# Patient Record
Sex: Female | Born: 1940 | Race: White | Hispanic: No | Marital: Married | Smoking: Former smoker
Health system: Southern US, Community
[De-identification: ages and names within clinical notes are randomized; demographics above are authoritative.]

## PROBLEM LIST (undated history)

## (undated) DIAGNOSIS — H3530 Unspecified macular degeneration: Secondary | ICD-10-CM

## (undated) HISTORY — PX: TUBAL LIGATION: SHX77

## (undated) HISTORY — PX: APPENDECTOMY: SHX54

## (undated) HISTORY — DX: Unspecified macular degeneration: H35.30

---

## 2001-06-23 ENCOUNTER — Ambulatory Visit (HOSPITAL_COMMUNITY): Admission: RE | Admit: 2001-06-23 | Discharge: 2001-06-23 | Payer: Self-pay | Admitting: Family Medicine

## 2001-06-23 ENCOUNTER — Encounter: Payer: Self-pay | Admitting: Family Medicine

## 2002-06-24 ENCOUNTER — Ambulatory Visit (HOSPITAL_COMMUNITY): Admission: RE | Admit: 2002-06-24 | Discharge: 2002-06-24 | Payer: Self-pay | Admitting: Family Medicine

## 2002-06-24 ENCOUNTER — Encounter: Payer: Self-pay | Admitting: Family Medicine

## 2002-07-06 ENCOUNTER — Encounter: Payer: Self-pay | Admitting: Family Medicine

## 2002-07-06 ENCOUNTER — Ambulatory Visit (HOSPITAL_COMMUNITY): Admission: RE | Admit: 2002-07-06 | Discharge: 2002-07-06 | Payer: Self-pay | Admitting: Family Medicine

## 2002-10-25 ENCOUNTER — Ambulatory Visit (HOSPITAL_COMMUNITY): Admission: RE | Admit: 2002-10-25 | Discharge: 2002-10-25 | Payer: Self-pay | Admitting: Family Medicine

## 2002-10-25 ENCOUNTER — Encounter: Payer: Self-pay | Admitting: Family Medicine

## 2003-07-12 ENCOUNTER — Ambulatory Visit (HOSPITAL_COMMUNITY): Admission: RE | Admit: 2003-07-12 | Discharge: 2003-07-12 | Payer: Self-pay | Admitting: Family Medicine

## 2003-08-28 ENCOUNTER — Ambulatory Visit (HOSPITAL_COMMUNITY): Admission: RE | Admit: 2003-08-28 | Discharge: 2003-08-28 | Payer: Self-pay | Admitting: Internal Medicine

## 2003-09-16 HISTORY — PX: COLON SURGERY: SHX602

## 2003-09-19 ENCOUNTER — Encounter (INDEPENDENT_AMBULATORY_CARE_PROVIDER_SITE_OTHER): Payer: Self-pay | Admitting: *Deleted

## 2003-09-19 ENCOUNTER — Inpatient Hospital Stay (HOSPITAL_COMMUNITY): Admission: RE | Admit: 2003-09-19 | Discharge: 2003-09-22 | Payer: Self-pay | Admitting: General Surgery

## 2003-12-14 ENCOUNTER — Ambulatory Visit (HOSPITAL_COMMUNITY): Admission: RE | Admit: 2003-12-14 | Discharge: 2003-12-14 | Payer: Self-pay | Admitting: General Surgery

## 2005-07-11 ENCOUNTER — Ambulatory Visit (HOSPITAL_COMMUNITY): Admission: RE | Admit: 2005-07-11 | Discharge: 2005-07-11 | Payer: Self-pay | Admitting: Family Medicine

## 2005-08-28 ENCOUNTER — Ambulatory Visit: Payer: Self-pay | Admitting: Internal Medicine

## 2005-09-03 ENCOUNTER — Ambulatory Visit: Payer: Self-pay | Admitting: Internal Medicine

## 2005-09-03 ENCOUNTER — Ambulatory Visit (HOSPITAL_COMMUNITY): Admission: RE | Admit: 2005-09-03 | Discharge: 2005-09-03 | Payer: Self-pay | Admitting: Internal Medicine

## 2005-09-03 ENCOUNTER — Encounter: Payer: Self-pay | Admitting: Internal Medicine

## 2005-09-03 HISTORY — PX: COLONOSCOPY: SHX174

## 2006-07-14 ENCOUNTER — Ambulatory Visit (HOSPITAL_COMMUNITY): Admission: RE | Admit: 2006-07-14 | Discharge: 2006-07-14 | Payer: Self-pay | Admitting: Family Medicine

## 2007-07-27 ENCOUNTER — Ambulatory Visit (HOSPITAL_COMMUNITY): Admission: RE | Admit: 2007-07-27 | Discharge: 2007-07-27 | Payer: Self-pay | Admitting: Family Medicine

## 2008-08-01 ENCOUNTER — Ambulatory Visit (HOSPITAL_COMMUNITY): Admission: RE | Admit: 2008-08-01 | Discharge: 2008-08-01 | Payer: Self-pay | Admitting: Family Medicine

## 2009-08-07 ENCOUNTER — Ambulatory Visit (HOSPITAL_COMMUNITY): Admission: RE | Admit: 2009-08-07 | Discharge: 2009-08-07 | Payer: Self-pay | Admitting: Family Medicine

## 2009-12-27 ENCOUNTER — Ambulatory Visit (HOSPITAL_COMMUNITY): Admission: RE | Admit: 2009-12-27 | Discharge: 2009-12-27 | Payer: Self-pay | Admitting: Family Medicine

## 2010-01-14 ENCOUNTER — Encounter (HOSPITAL_COMMUNITY): Admission: RE | Admit: 2010-01-14 | Discharge: 2010-02-13 | Payer: Self-pay | Admitting: Family Medicine

## 2010-02-15 ENCOUNTER — Encounter (HOSPITAL_COMMUNITY): Admission: RE | Admit: 2010-02-15 | Discharge: 2010-03-17 | Payer: Self-pay | Admitting: Sports Medicine

## 2010-03-20 ENCOUNTER — Encounter (HOSPITAL_COMMUNITY): Admission: RE | Admit: 2010-03-20 | Discharge: 2010-04-19 | Payer: Self-pay | Admitting: Sports Medicine

## 2010-08-13 ENCOUNTER — Ambulatory Visit (HOSPITAL_COMMUNITY)
Admission: RE | Admit: 2010-08-13 | Discharge: 2010-08-13 | Payer: Self-pay | Source: Home / Self Care | Admitting: Family Medicine

## 2010-09-10 ENCOUNTER — Encounter (INDEPENDENT_AMBULATORY_CARE_PROVIDER_SITE_OTHER): Payer: Self-pay

## 2010-09-15 HISTORY — PX: COLONOSCOPY: SHX174

## 2010-09-18 ENCOUNTER — Encounter: Payer: Self-pay | Admitting: Internal Medicine

## 2010-09-27 ENCOUNTER — Ambulatory Visit (HOSPITAL_COMMUNITY)
Admission: RE | Admit: 2010-09-27 | Discharge: 2010-09-27 | Payer: Self-pay | Source: Home / Self Care | Attending: Internal Medicine | Admitting: Internal Medicine

## 2010-10-01 ENCOUNTER — Encounter: Payer: Self-pay | Admitting: Internal Medicine

## 2010-10-15 NOTE — Op Note (Signed)
  NAME:  Debra George, Debra George           ACCOUNT NO.:  [id number]  MEDICAL RECORD NO.:  [id number]          PATIENT TYPE:  AMB  LOCATION:  DAY                           FACILITY:  [facility]  PHYSICIAN:  R. Roetta Sessions, M.D. DATE OF BIRTH:  04-Mar-1941  DATE OF PROCEDURE:  09/27/2010 DATE OF DISCHARGE:                              OPERATIVE REPORT   INDICATIONS FOR PROCEDURE:  A 70 year old lady with a history of villous adenoma requiring right hemicolectomy previously with a negative followup colonoscopy about 6 years ago.  She is devoid of any lower GI tract symptoms.  She is here for surveillance.  Risks, benefits, limitations, alternatives and imponderables have been discussed, questions answered.  Please see the documentation in the medical record.  PROCEDURE NOTE:  O2 saturation, blood pressure, pulse and respirations were monitored throughout the entire procedure.  CONSCIOUS SEDATION: 1. Versed 5 mg IV. 2. Demerol 100 mg IV in divided doses.  INSTRUMENT:  Pentax video chip system.  FINDINGS:  Digital rectal exam revealed no abnormalities.  Endoscopic findings:  Prep was excellent.  Colon:  Colonic mass were surveyed from the rectosigmoid junction through the left transverse right colon to the anastomosis with small bowel.  Anastomosis was well seen and photographed for the record.  The neoterminal ileum was intubated 10-cm. The segment of the GI tract appeared normal aside from some minimally adenomatous-looking mucosa at very anastomosis demarcation zone between small bowel and colon, see photos.  This area was biopsied from this level.  Scope was slowly and cautiously withdrawn.  All previously mentioned mucosal surfaces were again seen.  The residual colonic mucosa otherwise appeared entirely normal.  Scope was pulled down into the rectum, where a thorough examination of the rectal mucosa including retroflexed view of the anal verge demonstrated no abnormalities. Withdrawal  time 9 minutes.  IMPRESSION: 1. Normal rectum. 2. Good-looking normal residual colon aside from minimally adenomatous-     appearing mucosa.  The anastomosis with small bowel which may well     be a very abnormal, status post biopsy normal neoterminal ileum.  RECOMMENDATIONS: 1. Followup on path. 2. Further recommendations to follow.     Jonathon Bellows, M.D.     RMR/MEDQ  D:  09/27/2010  T:  09/27/2010  Job:  [id number]  cc:   Donna Bernard, M.D. Fax: [phone]  Electronically Signed by Lorrin Goodell M.D. on 10/15/2010 01:37:08 PM

## 2010-10-17 NOTE — Letter (Signed)
Summary: Patient Notice, Colon Biopsy Results  [facility]  [address]   [place], [place] [address]   Phone: [phone]  Fax: [phone]       October 01, 2010   Debra George [address] [place], [place]  [address] Jan 09, 1941    Dear [name],  I am pleased to inform you that the biopsies taken during your recent colonoscopy did not show any evidence of cancer or other abnormality upon pathologic examination.  Additional information/recommendations:  No further action is needed at this time.  Please follow-up with your primary care physician for your other healthcare needs.  You should have a repeat colonoscopy examination  in 5 years.  Please call us if you are having persistent problems or have questions about your condition that have not been fully answered at this time.  Sincerely,    R. Roetta Sessions MD, FACP [facility] Ph: [phone]    Fax: [phone]   Appended Document: Patient Notice, Colon Biopsy Results letter mailed to pt  Appended Document: Patient Notice, Colon Biopsy Results reminder in computer

## 2010-10-17 NOTE — Letter (Signed)
Summary: Recall, Screening Colonoscopy Only  [facility]  [address]   [place], [place] [address]   Phone: [phone]  Fax: [phone]    September 10, 2010  Debra George [address] [place], [place]  [address] Dec 25, 1940   Dear [name],   Our records indicate it is time to schedule your colonoscopy.    Please call our office at [phone] and ask for the nurse.   Thank you,  Hendricks Limes, LPN Cloria Spring, LPN  [facility] Ph: [phone]   Fax: [phone]

## 2010-10-17 NOTE — Letter (Signed)
Summary: TRIAGE ORDER  TRIAGE ORDER   Imported By: Ave Filter 09/18/2010 11:20:11  _____________________________________________________________________  External Attachment:    Type:   Image     Comment:   External Document

## 2011-01-31 NOTE — H&P (Signed)
NAME:  Debra George, Debra George           ACCOUNT NO.:  [id number]   MEDICAL RECORD NO.:  [id number]          PATIENT TYPE:  AMB   LOCATION:  DAY                           FACILITY:  [facility]   PHYSICIAN:  R. Roetta Sessions, M.D. DATE OF BIRTH:  1940-10-09   DATE OF ADMISSION:  08/28/2005  DATE OF DISCHARGE:  LH                                HISTORY & PHYSICAL   PRIMARY CARE PHYSICIAN:  Donna Bernard, M.D.   CHIEF COMPLAINT:  Follow up large villous adenoma of the cecum.   HISTORY OF PRESENT ILLNESS:  Debra George is a 70 year old, Caucasian female  who underwent a screening colonoscopy by Dr. Jena Gauss on August 28, 2003.  She was found to have a 5 x 5 cm infiltrating sessile appearing polyp  immediately distal to the ileocecal valve which was biopsied and found to be  a villous adenoma.  She was scheduled for right colectomy by Dr. Zachery Dakins  on September 19, 2003.  She has done quite well postoperatively.  She has not  returned until now for further surveillance due to the fact that she did not  have insurance.  She has been doing well.  She denies any problems with her  bowel movements.  She can have anywhere from one to three bowel movements a  day.  She denies any melena.  She has noted some scant bright red rectal  bleeding in her underwear especially when she is walking.  She generally  walks about 2 miles per day.  She also has some proctalgia and rectal  pruritus with this as well.  She feels she may have a hemorrhoid.  She  denies any abdominal pain, nausea or vomiting.  She denies any problems with  weight loss or appetite.   PAST MEDICAL HISTORY:  1.  Villous adenoma of the cecum, status post right colectomy.  Lesion was 5      x 5 cm.  2.  She had two D&Cs.   CURRENT MEDICATIONS:  1.  Os-Cal.  2.  Vitamin D-500 mg b.i.d.  3.  Tylenol p.r.n.   ALLERGIES:  No known drug allergies.   FAMILY HISTORY:  No known family history of colorectal carcinoma, liver or  chronic GI  problems.  Mother age 60 deceased from CHF.  Father age 91  deceased secondary to history of coronary artery disease.  She has one  brother with asthma and COPD.   SOCIAL HISTORY:  Debra George has been married x40 years.  She has one grown  healthy son.  She is retired.  She reports a 16 year old history of tobacco  use quitting in 31.  Denies any drug or alcohol use.   REVIEW OF SYSTEMS:  CONSTITUTIONAL:  Weight is stable.  Denies any fever or  chills.  Denies any fatigue.  CARDIOVASCULAR:  Denies chest pain or  palpitations.  RESPIRATORY:  No dyspnea, cough or hemoptysis.  GASTROINTESTINAL:  See HPI.  She denies any heartburn, indigestion,  dysphagia, odynophagia.   PHYSICAL EXAMINATION:  VITAL SIGNS:  Weight 137.5 pounds, height 68 inches,  temperature 97.6, blood pressure 130/78, pulse 88.  GENERAL:  Debra George is a 70 year old, Caucasian female who is alert and  oriented, pleasant, cooperative in no acute distress.  HEENT:  Sclerae clear, nonicteric, conjunctivae pink.  Oropharynx pink and  moist without any lesions.  NECK:  Supple without any masses or thyromegaly.  CHEST:  Heart regular rate and rhythm with normal S1, S2 without murmurs,  rubs or gallops.  LUNGS:  Clear to auscultation bilaterally.  ABDOMEN:  Positive bowel sounds x4.  No bruits auscultated.  Soft,  nontender, nondistended without palpable mass or hepatosplenomegaly.  No  retrosternal guarding.  EXTREMITIES:  Without clubbing or edema bilaterally.  RECTAL:  No external lesions visualized.  She has good sphincter tone given  her age.  No internal masses palpated.  Hemoccult was not performed.   IMPRESSION:  Debra George is a 70 year old, Caucasian female, status post  right colectomy for a cecal villous adenoma that was 5 x 5 cm in January  2005.  She is overdue for followup surveillance and is in need of  colonoscopy at this time.  She has noted some scant rectal bleeding  intermittently over the last  couple of weeks generally when she does a  significant amount of exercising.  Otherwise, she is doing quite well.   PLAN:  1.  Will schedule her for a colonoscopy with Dr. Jena Gauss in the near future.      I have discussed the procedure including risks and benefits including,      but limited to bleeding, infection, perforation and drug reaction and      consent will be obtained.  2.  Further recommendations pending colonoscopy.   We would like to thank Dr. Gerda Diss for allowing [place] to participate in the care  of Debra George.      Nicholas Lose, N.P.      Jonathon Bellows, M.D.  Electronically Signed    KC/MEDQ  D:  08/28/2005  T:  08/28/2005  Job:  [id number]   cc:   Donna Bernard, M.D.  Fax: [phone]

## 2011-01-31 NOTE — Op Note (Signed)
NAME:  Debra George, Debra George                     ACCOUNT NO.:  [id number]   MEDICAL RECORD NO.:  [id number]                   PATIENT TYPE:  AMB   LOCATION:  DAY                                  FACILITY:  [facility]   PHYSICIAN:  R. Roetta Sessions, M.D.              DATE OF BIRTH:  May 10, 1941   DATE OF PROCEDURE:  08/28/2003  DATE OF DISCHARGE:                                 OPERATIVE REPORT   PROCEDURE:  Screening colonoscopy (ultimately colonoscopy with biopsy).   INDICATIONS FOR PROCEDURE:  The patient is a 70 year old lady who has no  bowel symptoms and no family history of colorectal neoplasia.  She has never  had her entire lower GI tract evaluated.  She reports to me that she did  undergo a sigmoidoscopy some five or six years ago without any significant  findings.  She has been referred by the courtesy of Dr. Lubertha South for  colorectal cancer screening.  Consequently, colonoscopy is now being  offered.  This approach has been discussed with the patient.  The potential  risks, benefits, and alternatives have been reviewed and questions answered.  Please see my handwritten H&P for more information.   PROCEDURE:  O2 saturation, blood pressure, pulses, and respirations were  monitored throughout the entire procedure.  Conscious sedation was with  Versed 4 mg IV, Demerol 75 mg IV in divided doses.  The instrument used was  the Olympus video chip pediatric colonoscope.   FINDINGS:  Digital rectal examination revealed no abnormalities.   ENDOSCOPIC FINDINGS:  The prep was good.   Rectum:  Examination of the rectal mucosa including retroflex view of the  anal verge revealed no abnormalities.   Colon:  The colonic mucosa was surveyed from the rectosigmoid junction  through the left, transverse, right colon to the area of the appendiceal  orifice, ileocecal valve, and cecum.  From this level, the scope was slowly  withdrawn.  All previously mentioned mucosal surfaces were once again  seen.   The following abnormality was noted.  Immediately distal to the ileocecal  valve there was a 5 x 5-cm sprawling, infiltrating sessile neoplastic-  appearing process.  Some parts of this were very hard, and other areas were  soft.  It was biopsied multiple times.  It was not felt that this would be  removable endoscopically, particularly given its location.  Please see  photos.  The remainder of the colonic mucosa appeared normal.  The patient  tolerated the procedure well and was reactive in endoscopy.   IMPRESSION:  1. Normal rectum.  2. A 5 x 5-cnm infiltrating sessile-appearing neoplastic process immediately     distal to the ileocecal valve, biopsied multiple times.  The remainder of     the colonic mucosa appeared normal.   RECOMMENDATIONS:  1. Metabolic profile today, CBC, baseline CEA.  2. Surgical consultation for resection.   I have discussed my findings and recommendations with Dr. Lubertha South  via  telephone today as well as [name] and her husband.      ___________________________________________                                            Jonathon Bellows, M.D.   RMR/MEDQ  D:  08/28/2003  T:  08/28/2003  Job:  [id number]   cc:   Donna Bernard, M.D.  [address]  [place]  [place] [address]  Fax: [phone]

## 2011-01-31 NOTE — Op Note (Signed)
NAME:  Debra George, Debra George           ACCOUNT NO.:  [id number]   MEDICAL RECORD NO.:  [id number]          PATIENT TYPE:  AMB   LOCATION:  DAY                           FACILITY:  [facility]   PHYSICIAN:  R. Roetta Sessions, M.D. DATE OF BIRTH:  August 13, 1941   DATE OF PROCEDURE:  09/03/2005  DATE OF DISCHARGE:                                 OPERATIVE REPORT   PROCEDURE:  Colonoscopy with biopsy.   ENDOSCOPIST:  Gerrit Friends. Rourk, M.D.   INDICATIONS FOR PROCEDURE:  This 70 year old lady was found to have a  villous adenoma in her right colon at ileocecal valve two years ago. It was  not amenable to endoscopic resection.  She saw Dr. Zachery Dakins down in  [place] and had her right colon removed.  I do not have path report  available, but understand that there was no frank carcinoma.  She was to  return one year later, but she states because of insurance reasons she  failed to do so.  She is now back for surveillance.  She has not had any  lower GI tract symptoms.  Colonoscopy is now being done.  This approach has  been discussed with the patient at length. The potential risks, benefits,  and alternatives have been reviewed; questions answered.  She is agreeable.  Please see the documentation in the medical record.   PROCEDURE NOTE:  O2 saturation, blood pressure, pulse and respirations were  monitored throughout the entire procedure.   CONSCIOUS SEDATION:  Versed 4 mg IV, Demerol 75 mg IV in divided doses.   INSTRUMENT:  Olympus video chip system.   FINDINGS:  Digital rectal exam revealed no abnormalities.   ENDOSCOPIC FINDINGS:  The prep was good.   RECTUM:  Examination of the rectal mucosa including the retroflex view of  the anal verge revealed no abnormalities.   COLON:  The colonic mucosa was surveyed from the rectosigmoid junction  through the left transverse colon to the area of anastomosis with small  bowel.  This was well seen and the terminal small-bowel mucosa appeared   normal.   From this level the scope was slowly withdrawn.  All previously mentioned  mucosal surfaces were again seen.  There was a 3-mm diminutive polyp at the  hepatic flexure which was cold biopsied/removed.  The remainder of the  colonic mucosa appeared normal.  The patient tolerated the procedure well  and was reacted in endoscopy.   IMPRESSION:  1.  Normal rectum.  2.  Diminutive polyp at the hepatic flexure cold biopsied/removed.      Otherwise normal residual colonic mucosa.  3.  Normal appearing anastomosis with small bowel.   RECOMMENDATIONS:  1.  Follow up on path.  2.  Further recommendations to follow.      Jonathon Bellows, M.D.  Electronically Signed     RMR/MEDQ  D:  09/03/2005  T:  09/04/2005  Job:  [id number]   cc:   Donna Bernard, M.D.  Fax: [phone]   Anselm Pancoast. Zachery Dakins, M.D.  [address]  [place]  [place] [address]

## 2011-01-31 NOTE — Discharge Summary (Signed)
NAME:  Debra George, Debra George                     ACCOUNT NO.:  [id number]   MEDICAL RECORD NO.:  [id number]                   PATIENT TYPE:  INP   LOCATION:  0442                                 FACILITY:  [facility]   PHYSICIAN:  Anselm Pancoast. Zachery Dakins, M.D.          DATE OF BIRTH:  01-29-1941   DATE OF ADMISSION:  09/19/2003  DATE OF DISCHARGE:  09/22/2003                                 DISCHARGE SUMMARY   DISCHARGE DIAGNOSIS:  Tubovillous adenoma, right colon.   OPERATION:  Right colectomy.   HISTORY:  [name] is a 70 year old Caucasian female __________ Dr.  Lilyan Punt.  The patient had a routine colonoscopy prior to Christmas and  found a large villous adenoma in the ileocecal valve area.  The patient was  not anemic and had not ___________.  I saw her in the office __________ to  proceed with surgery.  She had a __________ surgery.  The patient was taken  to surgery.  Dr. Earlene Plater assisted, and the tumor was small, located within the  proximal cecum, and a right colectomy with stapled side-to-side functional  anastomosis was performed.  Postoperatively, she did nicely.  She was able  to void the following morning after surgery.  Foley was removed.  She was on  a started on a regular diet.  On the first and second postoperative day  ____________.  She was ready for discharge on September 21, 2003.  The path  report shows fortunately this was a large tubovillous adenoma.  There was no  evidence of dysplasia or carcinoma within the specimen.  The tumor itself  measured approximately _____________ 4.2 cm.  The patient will be seen in  the office for followup and the staples will be removed at her followup  appointment.  She was discharged on Vicodin for pain and instructed not to  drive when she is taking Vicodin.                                               Anselm Pancoast. Zachery Dakins, M.D.    WJW/MEDQ  D:  10/18/2003  T:  10/18/2003  Job:  [id number]

## 2011-01-31 NOTE — Op Note (Signed)
NAME:  Debra, George                     ACCOUNT NO.:  [id number]   MEDICAL RECORD NO.:  [id number]                   PATIENT TYPE:  INP   LOCATION:  X006                                 FACILITY:  [facility]   PHYSICIAN:  Anselm Pancoast. Zachery Dakins, M.D.          DATE OF BIRTH:  1940-11-08   DATE OF PROCEDURE:  09/19/2003  DATE OF DISCHARGE:                                 OPERATIVE REPORT   PREOPERATIVE DIAGNOSIS:  Villous adenoma, cecum.   POSTOPERATIVE DIAGNOSIS:  Villous adenoma, cecum.   OPERATION:  Right colectomy.   SURGEON:  Anselm Pancoast. Zachery Dakins, M.D.   ASSISTANT:  Sheppard Plumber. Earlene Plater, M.D.   ANESTHESIA:  General.   HISTORY:  Debra George is a 70 year old Caucasian female referred by  Dr. Marigene Ehlers in [place] where the patient had a routine colonoscopy prior  to Christmas and was found to have a large villous adenoma at the ileocecal  valve area.  The patient was not anemic when I saw her in the office on the  20th, and she elected to wait until after Christmas to proceed with a right  colectomy.  She basically is otherwise in good health.  She had had a  flexible sigmoidoscopy approximately 5 years ago with no family history of  colon cancer and I do not think that they have actually documented blood in  her stool.  She has always been reasonably thin in body habitus, and is not  on any type of chronic medications.  She has had a previous appendectomy and  previous C-section and I think with a tubal ligation.  The patient had  GoLYTELY, erythromycin and Neo-Mycin bowel prep in preparation for this  yesterday and was admitted prior to surgery this morning.  The patient was  given 3 g of Unasyn, she has PAS stockings and was taken to the operative  suite.  The patient had a small area over the left breast that looks like  possibly a epidermal cyst or it may be a cutaneous wart that she wants  excised and after induction of general anesthesia with Betadine we excised  this  and pathology exam - I do not think it is likely to be anything  serious.   The patient's abdomen was then prepped with Betadine surgical scrub  solution. A Foley catheter had been inserted.  I made a midline incision  extending up above where she had had the lower incision and then kind of  carefully entered into the peritoneal cavity.  She did have adhesions in the  lower abdomen where she has had an appendectomy which made a high lying  cecum and it was necessary to extend the incision just a little bit, but  still through this incision.  The omentum dropped down from the peritoneal  surface and I could then visualize the ileocecal valve and the hepatic  flexure and opened the peritoneum laterally and I kind of rotated the cecum  into the  wound. The hepatic flexure was also taken down and could feel a  little thickening in the cecal area but not of obvious hard tumor.  There  was no evidence of any enlargement, no liver ____________.  We then divided  the mesentery and took it down to the base of the right branch of the  superior mesentery and doubly ligated this with 2-0 Vicryl distally and then  the other mesenteric vessels were divided and ligated with 2-0 Vicryl.  I  elected to do a proximal side-to-side ileocecal anastomosis, an  ileotransverse colon anastomosis and the GIA was used to suture the  antimesenteric surface of the terminal ileum about 8 inches from the  ileocecal valve to the tip of the transverse colon and then the cut staple  line was inspected.  There were a couple of bleeders posteriorly requiring a  suture of 3-0 silk, and then we closed the anastomosis with a fire of the TA  60 with short staples and then removed the specimen from the field.  It was  necessary to suture some a few of the suture lines with 3-0 silk with good  hemostasis and the mesenteric defect was then closed with interrupted  sutures of 3-0 silk.  The areas lying about tensioned, dropped them  back  into the peritoneal cavity, the omentum was brought down over it and  reinspected, the pelvis et al, where the colon and hepatic flexure had been  freed up and there was not any bleeding.   The gallbladder looked like it had kind of congenital type adhesions around  it, the gallbladder itself was very thin and neither Dr. Earlene Plater nor I could  feel any stones within it and we took care not to injure the duodenum as the  hepatic flexure was mobilized because of the patient's very thin nature.  The midline incision was closed with running sutures of 0 Prolene with a #1  PDS continuous suture and skin was closed with staples.  We put one simple  stitch in the lower area of the breast that had been removed with 6-0 nylon  at completion of surgery.   The patient tolerated the procedure nicely and we will try to do without an  NG tube but keep it ready to go. I am going to use PCA morphine for  immediate postoperative pain control.  Sponge and needle counts were  correct.  Estimated blood loss was probably about 100 cc.                                               Anselm Pancoast. Zachery Dakins, M.D.    WJW/MEDQ  D:  09/19/2003  T:  09/19/2003  Job:  [id number]   cc:   Donna Bernard, M.D.  [address]  [place]  [place] [address]  Fax: [phone]

## 2011-04-14 ENCOUNTER — Emergency Department (HOSPITAL_COMMUNITY): Payer: Medicare Other

## 2011-04-14 ENCOUNTER — Emergency Department (HOSPITAL_COMMUNITY)
Admission: EM | Admit: 2011-04-14 | Discharge: 2011-04-14 | Disposition: A | Discharge status: Home / Self Care | Payer: Medicare Other | Attending: Emergency Medicine | Admitting: Emergency Medicine

## 2011-04-14 ENCOUNTER — Encounter: Payer: Self-pay | Admitting: Emergency Medicine

## 2011-04-14 DIAGNOSIS — S52509A Unspecified fracture of the lower end of unspecified radius, initial encounter for closed fracture: Secondary | ICD-10-CM

## 2011-04-14 DIAGNOSIS — M7989 Other specified soft tissue disorders: Secondary | ICD-10-CM | POA: Insufficient documentation

## 2011-04-14 DIAGNOSIS — Z87891 Personal history of nicotine dependence: Secondary | ICD-10-CM | POA: Insufficient documentation

## 2011-04-14 DIAGNOSIS — S63076A Dislocation of distal end of unspecified ulna, initial encounter: Secondary | ICD-10-CM

## 2011-04-14 DIAGNOSIS — M2550 Pain in unspecified joint: Secondary | ICD-10-CM | POA: Insufficient documentation

## 2011-04-14 DIAGNOSIS — W19XXXA Unspecified fall, initial encounter: Secondary | ICD-10-CM | POA: Insufficient documentation

## 2011-04-14 DIAGNOSIS — S62109A Fracture of unspecified carpal bone, unspecified wrist, initial encounter for closed fracture: Secondary | ICD-10-CM | POA: Insufficient documentation

## 2011-04-14 MED ORDER — HYDROCODONE-ACETAMINOPHEN 7.5-325 MG PO TABS: 1.0000 | ORAL_TABLET | ORAL | Status: AC | PRN

## 2011-04-14 NOTE — ED Notes (Signed)
Pt states she fell this am. Abrasions to arms bilaterally. Complaining of R wrist pain

## 2011-04-14 NOTE — ED Notes (Signed)
Sugar tong splint and sling applied to R arm per PA order. Pt tolerated well. NAD noted. Awaiting disposition.

## 2011-04-14 NOTE — ED Provider Notes (Addendum)
History     Chief Complaint  Patient presents with  . Fall   Patient is a 70 y.o. female presenting with fall. The history is provided by the patient.  Fall The accident occurred 3 to 5 hours ago. The fall occurred while walking. She landed on concrete. The point of impact was the right wrist (back). The pain is present in the right wrist. The pain is moderate. She was ambulatory at the scene. There was no drug use involved in the accident. There was no alcohol use involved in the accident. Pertinent negatives include no visual change, no numbness, no abdominal pain, no nausea, no vomiting, no hematuria and no loss of consciousness. The symptoms are aggravated by rotation. She has tried ice and elevation for the symptoms. The treatment provided mild relief.    History reviewed. No pertinent past medical history.  History reviewed. No pertinent past surgical history.  History reviewed. No pertinent family history.  History  Substance Use Topics  . Smoking status: Former Games developer  . Smokeless tobacco: Not on file  . Alcohol Use: No    OB History    Grav Para Term Preterm Abortions TAB SAB Ect Mult Living                  Review of Systems  Constitutional: Negative for activity change.       All ROS Neg except as noted in HPI  HENT: Negative for nosebleeds and neck pain.   Eyes: Negative for photophobia and discharge.  Respiratory: Negative for cough, shortness of breath and wheezing.   Cardiovascular: Negative for chest pain and palpitations.  Gastrointestinal: Negative for nausea, vomiting, abdominal pain and blood in stool.  Genitourinary: Negative for dysuria, frequency and hematuria.  Musculoskeletal: Positive for joint swelling and arthralgias. Negative for back pain.  Skin: Negative.   Neurological: Negative for dizziness, seizures, loss of consciousness, speech difficulty and numbness.  Psychiatric/Behavioral: Negative for hallucinations and confusion.    Physical  Exam  BP 144/74  Pulse 91  Temp(Src) 98.2 F (36.8 C) (Oral)  Resp 20  Ht 5\' 8"  (1.727 m)  Wt 135 lb (61.236 kg)  BMI 20.53 kg/m2  SpO2 100%  Physical Exam  Nursing note and vitals reviewed. Constitutional: She is oriented to person, place, and time. She appears well-developed and well-nourished.  Non-toxic appearance.  HENT:  Head: Normocephalic.  Right Ear: Tympanic membrane and external ear normal.  Left Ear: Tympanic membrane and external ear normal.  Eyes: EOM and lids are normal. Pupils are equal, round, and reactive to light.  Neck: Normal range of motion. Neck supple. Carotid bruit is not present.  Cardiovascular: Normal rate, regular rhythm, normal heart sounds, intact distal pulses and normal pulses.   Pulmonary/Chest: Breath sounds normal. No respiratory distress.  Abdominal: Soft. Bowel sounds are normal. There is no tenderness. There is no guarding.  Musculoskeletal:       Swelling and bruising of the right wrist. FROM of the elbow and shoulder. Decrease ROM of the left shoulder (not new).  Abrasions of the mid back. And the left elbow.  Lymphadenopathy:       Head (right side): No submandibular adenopathy present.       Head (left side): No submandibular adenopathy present.    She has no cervical adenopathy.  Neurological: She is alert and oriented to person, place, and time. She has normal strength. No cranial nerve deficit or sensory deficit.  Skin: Skin is warm and dry.  Psychiatric:  She has a normal mood and affect. Her speech is normal.    ED Course  Procedures  MDM I have reviewed nursing notes, vital signs, and all appropriate lab and imaging results for this patient.      Kathie Dike, PA 04/14/11 [address] [place], [place] 08/01/11 [phone]

## 2011-04-28 NOTE — ED Provider Notes (Addendum)
Evaluation and management procedures were performed by the PA/NP under my supervision/collaboration.      Felisa Bonier, MD 08/02/11 [phone]

## 2011-08-01 ENCOUNTER — Other Ambulatory Visit: Payer: Self-pay | Admitting: Family Medicine

## 2011-08-01 DIAGNOSIS — Z139 Encounter for screening, unspecified: Secondary | ICD-10-CM

## 2011-08-02 NOTE — ED Provider Notes (Signed)
Evaluation and management procedures were performed by the PA/NP under my supervision/collaboration.    Felisa Bonier, MD 08/02/11 [phone]

## 2011-08-18 ENCOUNTER — Ambulatory Visit (HOSPITAL_COMMUNITY)
Admission: RE | Admit: 2011-08-18 | Discharge: 2011-08-18 | Disposition: A | Discharge status: Home / Self Care | Payer: Medicare Other | Source: Ambulatory Visit | Attending: Family Medicine | Admitting: Family Medicine

## 2011-08-18 DIAGNOSIS — Z1231 Encounter for screening mammogram for malignant neoplasm of breast: Secondary | ICD-10-CM | POA: Insufficient documentation

## 2011-08-18 DIAGNOSIS — Z139 Encounter for screening, unspecified: Secondary | ICD-10-CM

## 2011-09-11 ENCOUNTER — Other Ambulatory Visit: Payer: Self-pay | Admitting: Dermatology

## 2012-07-30 ENCOUNTER — Other Ambulatory Visit: Payer: Self-pay | Admitting: Family Medicine

## 2012-07-30 DIAGNOSIS — Z139 Encounter for screening, unspecified: Secondary | ICD-10-CM

## 2012-08-19 ENCOUNTER — Ambulatory Visit (HOSPITAL_COMMUNITY)
Admission: RE | Admit: 2012-08-19 | Discharge: 2012-08-19 | Disposition: A | Discharge status: Home / Self Care | Payer: Medicare Other | Source: Ambulatory Visit | Attending: Family Medicine | Admitting: Family Medicine

## 2012-08-19 DIAGNOSIS — Z139 Encounter for screening, unspecified: Secondary | ICD-10-CM

## 2012-08-19 DIAGNOSIS — Z1231 Encounter for screening mammogram for malignant neoplasm of breast: Secondary | ICD-10-CM | POA: Insufficient documentation

## 2012-11-23 IMAGING — MG MM DIGITAL SCREENING
4 series · 4 of 4 positions shown · non-contrast
Comparison: none

DG SCREEN MAMMOGRAM BILATERAL
Bilateral CC and MLO view(s) were taken.

DIGITAL SCREENING MAMMOGRAM WITH CAD:
The breast tissue is heterogeneously dense.  No masses or malignant type calcifications are 
identified.  Compared with prior studies.
Images were processed with CAD.

[L CC]
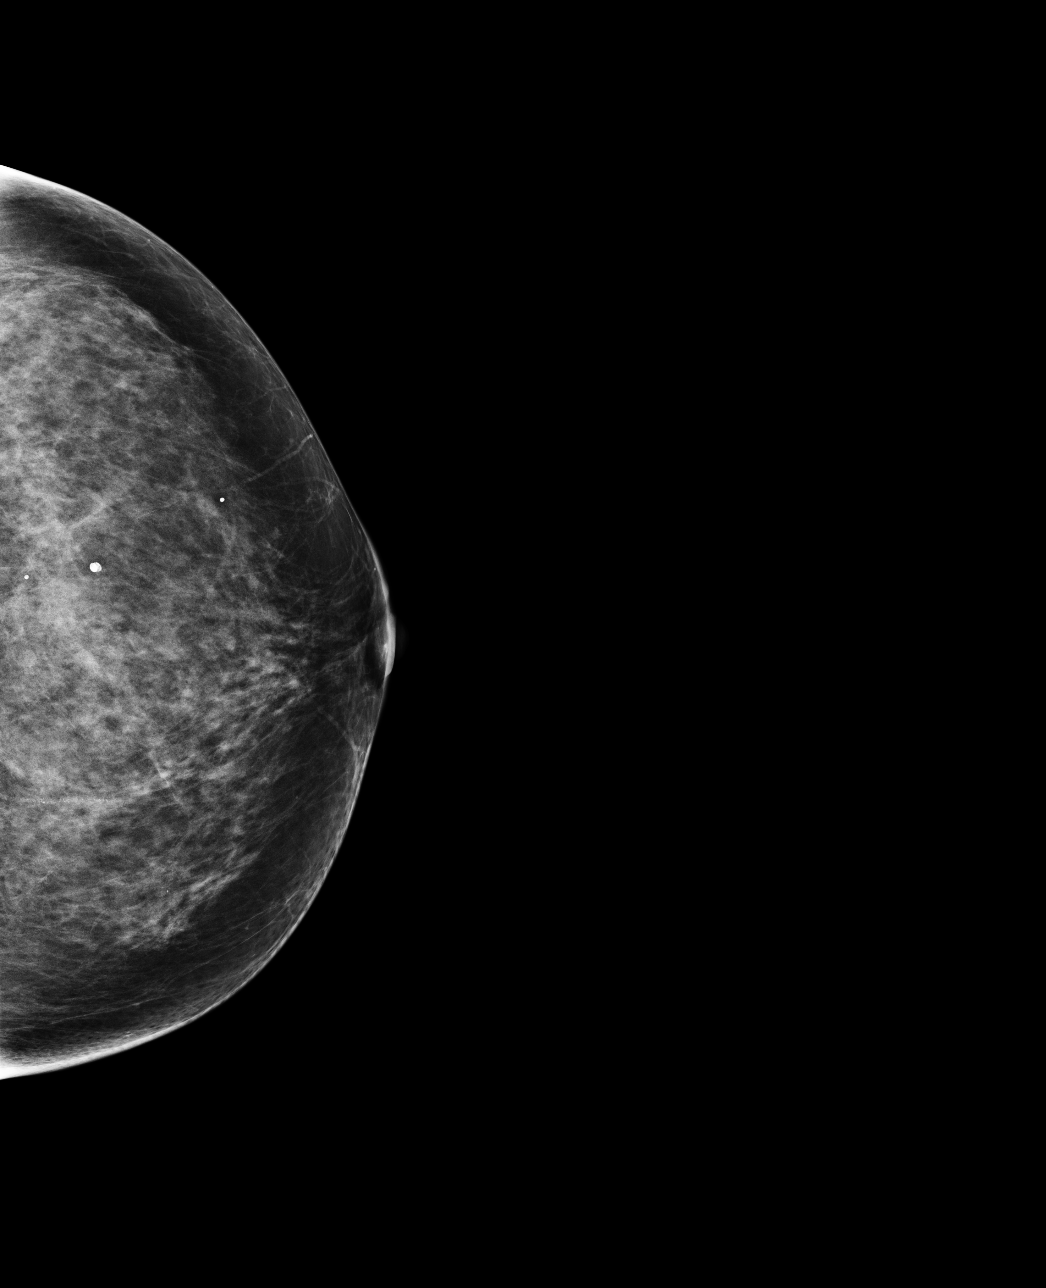

[L MLO]
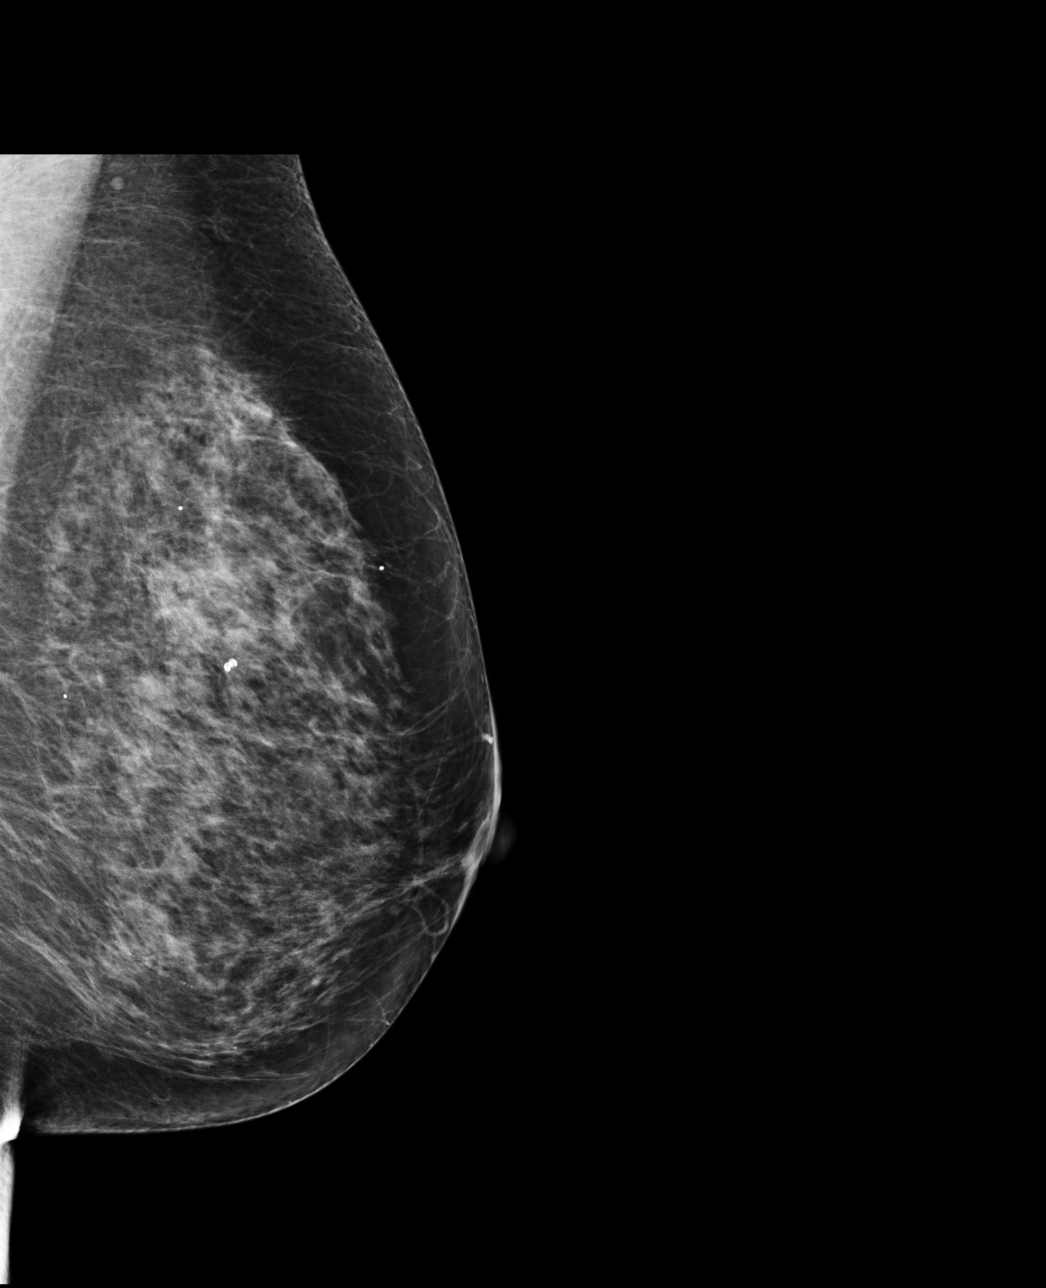

[R CC]
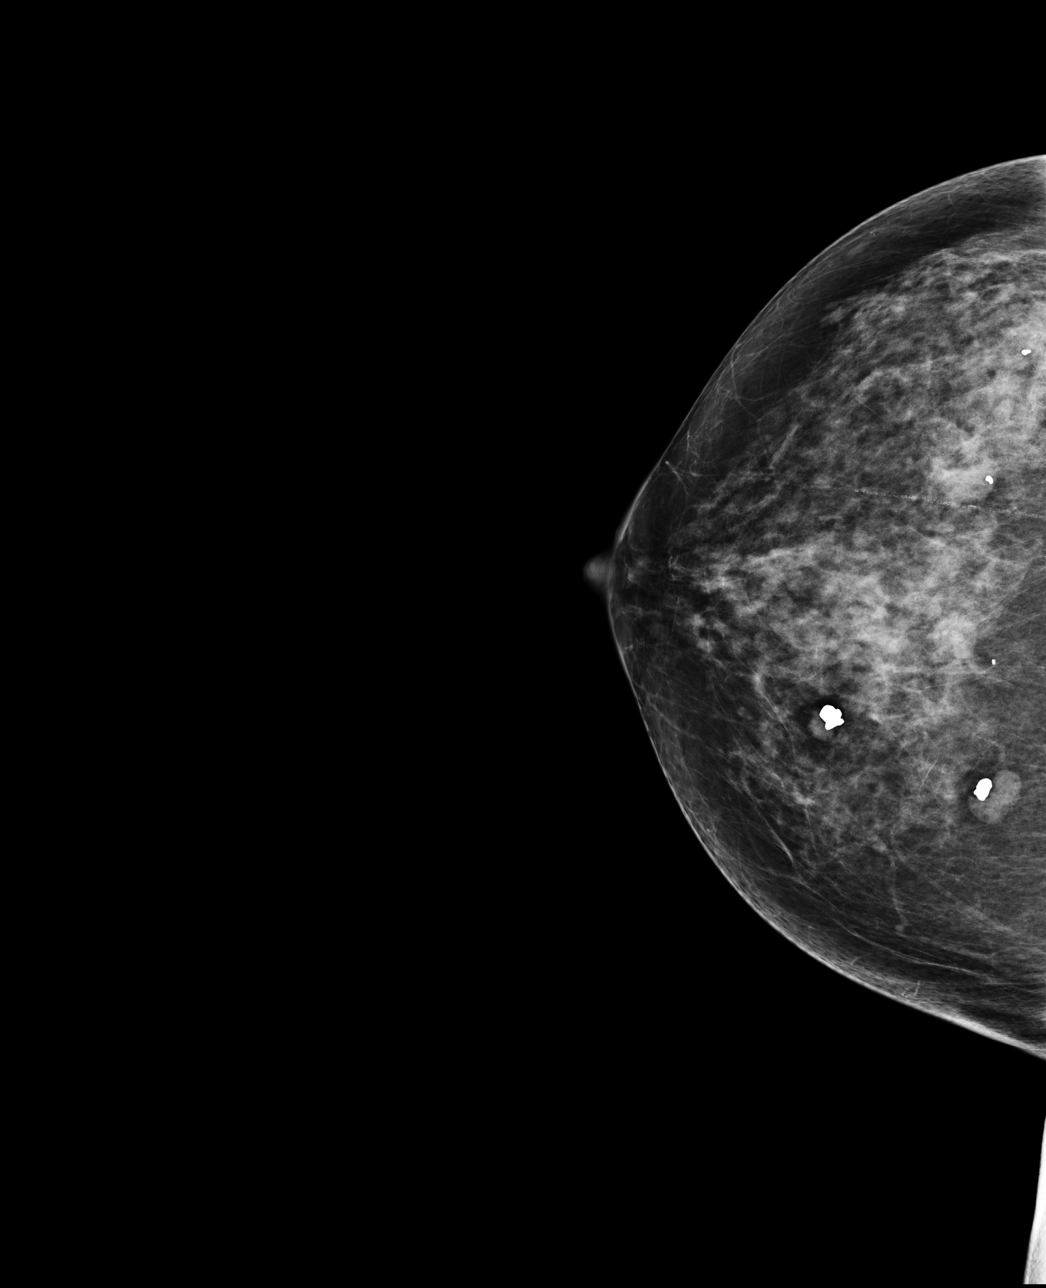

[R MLO]
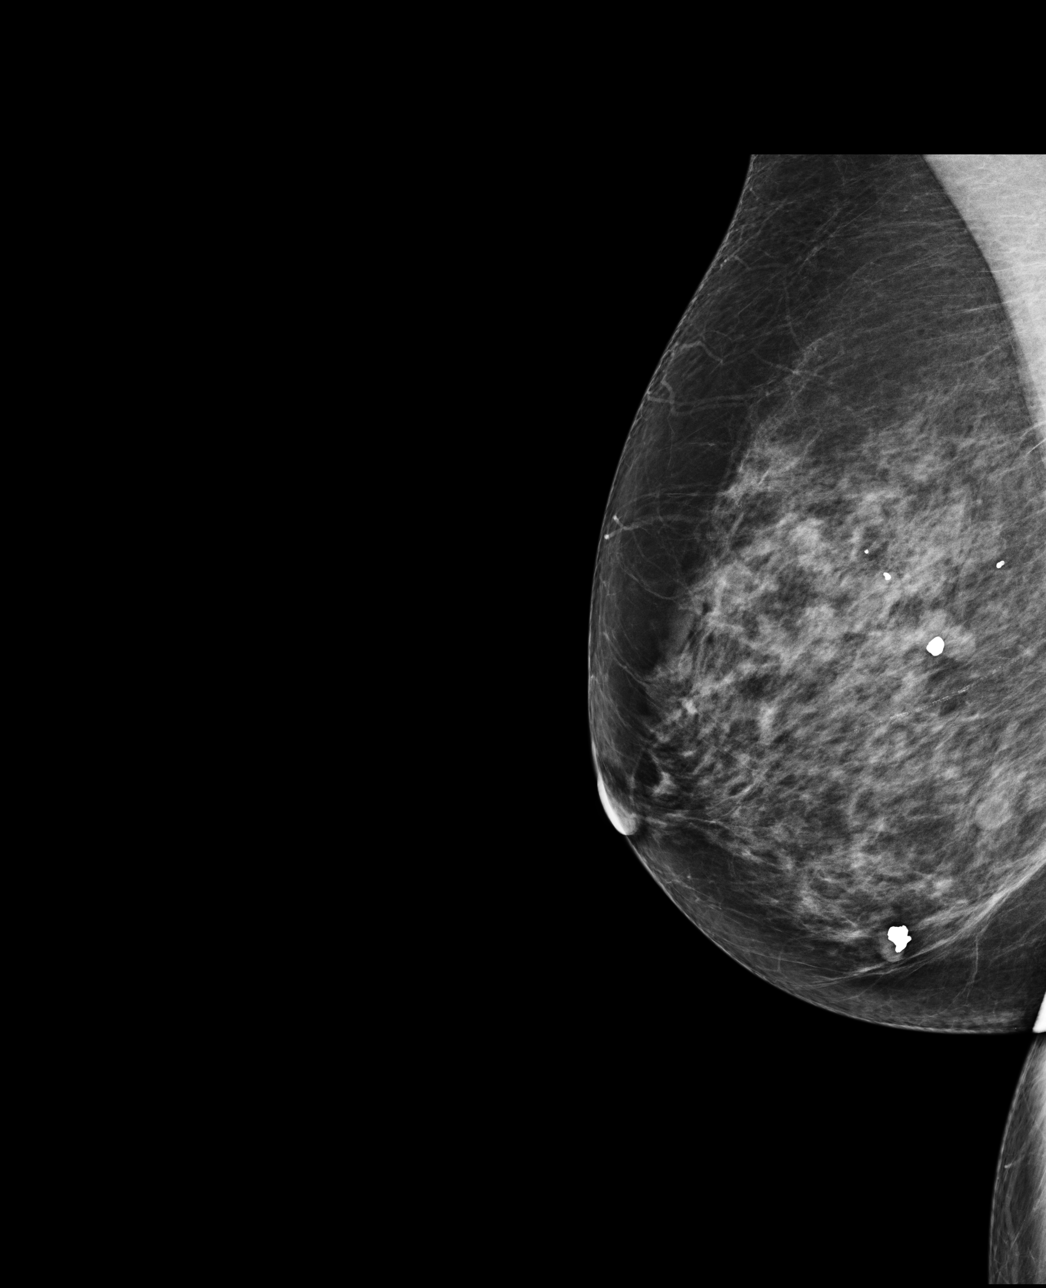

[4 of 4 positions shown; findings below may reference images not displayed]

IMPRESSION: No specific mammographic evidence of malignancy.  Next screening mammogram is recommended in one 
year.

A result letter of this screening mammogram will be mailed directly to the patient.

ASSESSMENT: Negative - BI-RADS 1

Screening mammogram in 1 year.
,

## 2013-06-28 ENCOUNTER — Encounter: Payer: Self-pay | Admitting: Family Medicine

## 2013-06-28 ENCOUNTER — Ambulatory Visit (INDEPENDENT_AMBULATORY_CARE_PROVIDER_SITE_OTHER): Payer: Medicare Other | Admitting: Family Medicine

## 2013-06-28 VITALS — BP 160/90 | Temp 98.9°F | Ht 68.0 in | Wt 140.2 lb

## 2013-06-28 DIAGNOSIS — J209 Acute bronchitis, unspecified: Secondary | ICD-10-CM

## 2013-06-28 MED ORDER — LEVOFLOXACIN 500 MG PO TABS: 500.0000 mg | ORAL_TABLET | Freq: Every day | ORAL | Status: AC

## 2013-06-28 NOTE — Progress Notes (Signed)
  Subjective:    Patient ID: Debra George, female    DOB: Aug 05, 1941, 72 y.o.   MRN: [id number]  Fever  This is a new problem. The current episode started in the past 7 days. The maximum temperature noted was 99 to 99.9 F. The temperature was taken using an oral thermometer. Associated symptoms include congestion, ear pain and wheezing. Treatments tried: Robitussin. The treatment provided mild relief.   Non productive  Highest temp 99.2   Review of Systems  Constitutional: Positive for fever.  HENT: Positive for congestion and ear pain.   Respiratory: Positive for wheezing.        Objective:   Physical Exam  Alert mild malaise. HEENT mild nasal congestion pharynx normal. Lungs some rhonchi no tachypnea no wheezes no crackles.      Assessment & Plan:  Impression acute bronchitis. Plan Levaquin daily 10 days. Robitussin when necessary. Warning signs discussed. WSL

## 2013-08-08 ENCOUNTER — Telehealth: Payer: Self-pay | Admitting: Family Medicine

## 2013-08-08 NOTE — Telephone Encounter (Signed)
Mr. Schamberger walked in to inquire on Ms. Curet account as they received a bill from our office of $155.  After viewing our records and seeing the response history from [facility] that patient could not be ID'd as their insured, I gave Mr. Skalsky this information.  He then proceeded to inform me that he had already talked with his insurance agent and already knows that his wife has coverage.  One reason in the response history states Reason 59 - Patient Birth Date Mismatch.  I asked him to phone the Insurance company and to make sure the insurance company has the correct date of birth.  We did verify that her DOB was correct in our system.  He became upset with me and told me he would be doing none of this as he already knew she had coverage and for me to give this information to the Insurance lady and she would make sure this would get taken care of.  I tried to let him know I was trying to help him and he said NO he wanted me to give this to the insurance lady!!!

## 2013-08-15 ENCOUNTER — Other Ambulatory Visit: Payer: Self-pay | Admitting: Family Medicine

## 2013-08-15 DIAGNOSIS — Z139 Encounter for screening, unspecified: Secondary | ICD-10-CM

## 2013-08-22 ENCOUNTER — Ambulatory Visit (HOSPITAL_COMMUNITY)
Admission: RE | Admit: 2013-08-22 | Discharge: 2013-08-22 | Disposition: A | Discharge status: Home / Self Care | Payer: Medicare Other | Source: Ambulatory Visit | Attending: Family Medicine | Admitting: Family Medicine

## 2013-08-22 DIAGNOSIS — Z139 Encounter for screening, unspecified: Secondary | ICD-10-CM

## 2013-08-22 DIAGNOSIS — Z1231 Encounter for screening mammogram for malignant neoplasm of breast: Secondary | ICD-10-CM | POA: Insufficient documentation

## 2013-09-02 ENCOUNTER — Ambulatory Visit (INDEPENDENT_AMBULATORY_CARE_PROVIDER_SITE_OTHER): Payer: Medicare Other | Admitting: Family Medicine

## 2013-09-02 ENCOUNTER — Encounter: Payer: Self-pay | Admitting: Family Medicine

## 2013-09-02 VITALS — BP 150/86 | Ht 67.5 in | Wt 137.2 lb

## 2013-09-02 DIAGNOSIS — Z0000 Encounter for general adult medical examination without abnormal findings: Secondary | ICD-10-CM

## 2013-09-02 NOTE — Progress Notes (Signed)
   Subjective:    Patient ID: Debra George, female    DOB: Sep 29, 1940, 72 y.o.   MRN: [id number]  HPI Patient arrives for a wellness exam. Mini cog test passed- No falls in last 6 mths.  Exercise thre e to four times per wk  Hip acted up recently but beter now  Back to exercise  utd on colonoscopy and pneum vac   Still takes ca and vit two per day  Review of Systems  Constitutional: Negative for activity change, appetite change and fatigue.  HENT: Negative for congestion, ear discharge and rhinorrhea.   Eyes: Negative for discharge.  Respiratory: Negative for cough, chest tightness and wheezing.   Cardiovascular: Negative for chest pain.  Gastrointestinal: Negative for vomiting and abdominal pain.  Genitourinary: Negative for frequency and difficulty urinating.  Musculoskeletal: Negative for neck pain.  Allergic/Immunologic: Negative for environmental allergies and food allergies.  Neurological: Negative for weakness and headaches.  Psychiatric/Behavioral: Negative for behavioral problems and agitation.       Objective:   Physical Exam  Vitals reviewed. Constitutional: She is oriented to person, place, and time. She appears well-developed and well-nourished.  HENT:  Head: Normocephalic.  Right Ear: External ear normal.  Left Ear: External ear normal.  Eyes: Pupils are equal, round, and reactive to light.  Neck: Normal range of motion. No thyromegaly present.  Cardiovascular: Normal rate, regular rhythm, normal heart sounds and intact distal pulses.   No murmur heard. Pulmonary/Chest: Effort normal and breath sounds normal. No respiratory distress. She has no wheezes.  Abdominal: Soft. Bowel sounds are normal. She exhibits no distension and no mass. There is no tenderness.  Genitourinary: Vagina normal and uterus normal.  Pap smear not obtained per usual guidelines  Musculoskeletal: Normal range of motion. She exhibits no edema and no tenderness.    Lymphadenopathy:    She has no cervical adenopathy.  Neurological: She is alert and oriented to person, place, and time. She exhibits normal muscle tone.  Skin: Skin is warm and dry.  Psychiatric: She has a normal mood and affect. Her behavior is normal.          Assessment & Plan:  Impression 1 preventive exam discussed plan anticipatory guidance given. Appropriate vaccines discussed. Hemoccult cards. Maintain calcium vitamin. Maintain regular mammograms. Appropriate blood work. WSL

## 2013-09-05 LAB — GLUCOSE, RANDOM: Glucose, Bld: 83 mg/dL (ref 70–99)

## 2013-09-05 LAB — LIPID PANEL: Cholesterol: 181 mg/dL (ref 0–200)

## 2013-09-11 ENCOUNTER — Encounter: Payer: Self-pay | Admitting: Family Medicine

## 2013-09-12 ENCOUNTER — Other Ambulatory Visit: Payer: Self-pay | Admitting: *Deleted

## 2013-09-12 DIAGNOSIS — Z0000 Encounter for general adult medical examination without abnormal findings: Secondary | ICD-10-CM

## 2013-09-12 LAB — POC HEMOCCULT BLD/STL (HOME/3-CARD/SCREEN)
Card #2 Fecal Occult Blod, POC: NEGATIVE
Fecal Occult Blood, POC: NEGATIVE

## 2013-09-21 ENCOUNTER — Encounter: Payer: Self-pay | Admitting: Family Medicine

## 2013-09-21 ENCOUNTER — Ambulatory Visit (INDEPENDENT_AMBULATORY_CARE_PROVIDER_SITE_OTHER): Payer: Medicare Other | Admitting: Family Medicine

## 2013-09-21 VITALS — BP 154/82 | Temp 98.1°F | Ht 68.0 in | Wt 135.6 lb

## 2013-09-21 DIAGNOSIS — R197 Diarrhea, unspecified: Secondary | ICD-10-CM

## 2013-09-21 NOTE — Patient Instructions (Signed)
This is likely a prolonged stomach virus  We have a couple moving thru the community right now  Until this goes away,rec no milk products no milk no cheese and no ice cream  Cut down on fried and greasy foods  Go with mild easily digestible foods. Crackers soup boiled rice or potatoes  Bananas are okay  otc immodium caplets. One every four to six hours as long as your having diarrhea

## 2013-09-21 NOTE — Progress Notes (Signed)
   Subjective:    Patient ID: Debra George, female    DOB: [birth year], 73 y.o.   MRN: [id number]  Diarrhea  This is a new problem. The current episode started in the past 7 days. The problem occurs 5 to 10 times per day. The stool consistency is described as watery. The patient states that diarrhea awakens her from sleep. Associated symptoms include bloating and increased flatus. Nothing aggravates the symptoms. There are no known risk factors. She has tried increased fluids (Equate) for the symptoms. The treatment provided no relief.   Very sig freq loose stools. No vom. No nausea  Low gr temp  Improved transiently, then worsened Took equate antacids  Review of Systems  Gastrointestinal: Positive for diarrhea, bloating and flatus.   no fever no chills no rash ROS otherwise negative     Objective:   Physical Exam Alert good hydration. Lungs clear. Heart regular in rhythm. Abdomen hyperactive bowel sounds. No discrete tenderness. No rebound no guarding.       Assessment & Plan:  gastroenteritis discussed at great length. Plan symptomatic care discussed. Imodium when necessary. Dietary management discussed. Warning signs discussed. WSL

## 2014-08-01 ENCOUNTER — Other Ambulatory Visit: Payer: Self-pay | Admitting: Family Medicine

## 2014-08-01 DIAGNOSIS — Z1231 Encounter for screening mammogram for malignant neoplasm of breast: Secondary | ICD-10-CM

## 2014-08-28 ENCOUNTER — Ambulatory Visit (HOSPITAL_COMMUNITY)
Admission: RE | Admit: 2014-08-28 | Discharge: 2014-08-28 | Disposition: A | Discharge status: Home / Self Care | Payer: Medicare Other | Source: Ambulatory Visit | Attending: Family Medicine | Admitting: Family Medicine

## 2014-08-28 DIAGNOSIS — Z1231 Encounter for screening mammogram for malignant neoplasm of breast: Secondary | ICD-10-CM | POA: Insufficient documentation

## 2014-09-05 ENCOUNTER — Ambulatory Visit (INDEPENDENT_AMBULATORY_CARE_PROVIDER_SITE_OTHER): Payer: Medicare Other | Admitting: Family Medicine

## 2014-09-05 ENCOUNTER — Encounter: Payer: Self-pay | Admitting: Family Medicine

## 2014-09-05 VITALS — BP 132/80 | Ht 68.0 in | Wt 133.1 lb

## 2014-09-05 DIAGNOSIS — Z0000 Encounter for general adult medical examination without abnormal findings: Secondary | ICD-10-CM

## 2014-09-05 DIAGNOSIS — Z23 Encounter for immunization: Secondary | ICD-10-CM

## 2014-09-05 DIAGNOSIS — Z1211 Encounter for screening for malignant neoplasm of colon: Secondary | ICD-10-CM

## 2014-09-05 NOTE — Progress Notes (Signed)
   Subjective:    Patient ID: Debra George, female    DOB: [birth year], 73 y.o.   MRN: [id number]  HPI AWV- Annual Wellness Visit  The patient was seen for their annual wellness visit. The patient's past medical history, surgical history, and family history were reviewed. Pertinent vaccines were reviewed ( tetanus, pneumonia, shingles, flu) The patient's medication list was reviewed and updated.  The height and weight were entered. The patient's current BMI is: 20.24  Cognitive screening was completed. Outcome of Mini - Cog: passed  Falls within the past 6 months:none  Current tobacco usage:non-smoker (All patients who use tobacco were given written and verbal information on quitting)  Recent listing of emergency department/hospitalizations over the past year were reviewed.  current specialist the patient sees on a regular basis: none   Medicare annual wellness visit patient questionnaire was reviewed.  A written screening schedule for the patient for the next 5-10 years was given. Appropriate discussion of followup regarding next visit was discussed.  Patient states that she has no other concerns at this time.   Colon due 2017  Slight ache in low back  [facility]   Review of Systems  Constitutional: Negative for activity change, appetite change and fatigue.  HENT: Negative for congestion, ear discharge and rhinorrhea.   Eyes: Negative for discharge.  Respiratory: Negative for cough, chest tightness and wheezing.   Cardiovascular: Negative for chest pain.  Gastrointestinal: Negative for vomiting and abdominal pain.  Genitourinary: Negative for frequency and difficulty urinating.  Musculoskeletal: Negative for neck pain.  Allergic/Immunologic: Negative for environmental allergies and food allergies.  Neurological: Negative for weakness and headaches.  Psychiatric/Behavioral: Negative for behavioral problems and agitation.  All other systems  reviewed and are negative.      Objective:   Physical Exam  Constitutional: She is oriented to person, place, and time. She appears well-developed and well-nourished.  HENT:  Head: Normocephalic.  Right Ear: External ear normal.  Left Ear: External ear normal.  Eyes: Pupils are equal, round, and reactive to light.  Neck: Normal range of motion. No thyromegaly present.  Cardiovascular: Normal rate, regular rhythm, normal heart sounds and intact distal pulses.   No murmur heard. Pulmonary/Chest: Effort normal and breath sounds normal. No respiratory distress. She has no wheezes.  Abdominal: Soft. Bowel sounds are normal. She exhibits no distension and no mass. There is no tenderness.  Genitourinary: Vagina normal and uterus normal.  Bilateral breast exam within normal limits  Musculoskeletal: Normal range of motion. She exhibits no edema or tenderness.  Lymphadenopathy:    She has no cervical adenopathy.  Neurological: She is alert and oriented to person, place, and time. She exhibits normal muscle tone.  Skin: Skin is warm and dry.  Psychiatric: She has a normal mood and affect. Her behavior is normal.  Vitals reviewed.         Assessment & Plan:  Impression 1 almost exam plan Hemoccult cards. Prevnar. Just had mammogram. Colonoscopy June 2017. Encouraged calcium supplement. Diet and exercise discussed. WSL

## 2014-09-06 LAB — GLUCOSE, RANDOM: Glucose, Bld: 89 mg/dL (ref 70–99)

## 2014-09-06 LAB — LIPID PANEL
CHOL/HDL RATIO: 3.6 ratio
CHOLESTEROL: 193 mg/dL (ref 0–200)
HDL: 54 mg/dL (ref >39)
LDL Cholesterol: 113 mg/dL — ABNORMAL HIGH (ref 0–99)
TRIGLYCERIDES: 130 mg/dL (ref <150)
VLDL: 26 mg/dL (ref 0–40)

## 2014-09-13 ENCOUNTER — Encounter: Payer: Self-pay | Admitting: Family Medicine

## 2014-09-14 LAB — POC HEMOCCULT BLD/STL (HOME/3-CARD/SCREEN)
Card #2 Fecal Occult Blod, POC: NEGATIVE
Card #3 Fecal Occult Blood, POC: NEGATIVE
FECAL OCCULT BLD: NEGATIVE

## 2014-11-01 ENCOUNTER — Ambulatory Visit (INDEPENDENT_AMBULATORY_CARE_PROVIDER_SITE_OTHER): Payer: Medicare Other | Admitting: Family Medicine

## 2014-11-01 ENCOUNTER — Encounter: Payer: Self-pay | Admitting: Family Medicine

## 2014-11-01 VITALS — BP 130/80 | Temp 97.8°F | Ht 68.0 in | Wt 136.5 lb

## 2014-11-01 DIAGNOSIS — J329 Chronic sinusitis, unspecified: Secondary | ICD-10-CM

## 2014-11-01 MED ORDER — AMOXICILLIN 500 MG PO CAPS: 500.0000 mg | ORAL_CAPSULE | Freq: Three times a day (TID) | ORAL | Status: DC

## 2014-11-01 NOTE — Progress Notes (Signed)
   Subjective:    Patient ID: [name], female    DOB: 26-Dec-1940, 74 y.o.   MRN: [id number]  Sinusitis This is a new problem. The current episode started in the past 7 days. The problem is unchanged. There has been no fever. The pain is moderate. Associated symptoms include congestion, coughing, headaches, sinus pressure and a sore throat. Past treatments include acetaminophen. The treatment provided mild relief.   Patient states that she has no other concerns at this time.  Husband had similar symptoms  Eight d ago deeveloped similar symptoms  Started up agai on Monday  Frontal and neck pressure  Dim energy  throa tsore int the morning  Tried tylenol and tried robitussin   Review of Systems  HENT: Positive for congestion, sinus pressure and sore throat.   Respiratory: Positive for cough.   Neurological: Positive for headaches.       Objective:   Physical Exam  alert moderate malaise hydration good. HEENT moderate his congestion frontal tenderness. Lungs clear. Heart regular in rhythm.       Assessment & Plan:   impression acute rhinosinusitis plan antibiotics prescribed. Since Medicare discussed. Warning signs discussed. WSL

## 2015-04-17 DIAGNOSIS — H3531 Nonexudative age-related macular degeneration: Secondary | ICD-10-CM | POA: Diagnosis not present

## 2015-04-17 DIAGNOSIS — H524 Presbyopia: Secondary | ICD-10-CM | POA: Diagnosis not present

## 2015-04-17 DIAGNOSIS — H2513 Age-related nuclear cataract, bilateral: Secondary | ICD-10-CM | POA: Diagnosis not present

## 2015-04-17 DIAGNOSIS — H5213 Myopia, bilateral: Secondary | ICD-10-CM | POA: Diagnosis not present

## 2015-05-02 ENCOUNTER — Ambulatory Visit (INDEPENDENT_AMBULATORY_CARE_PROVIDER_SITE_OTHER): Payer: Medicare Other | Admitting: Family Medicine

## 2015-05-02 ENCOUNTER — Encounter: Payer: Self-pay | Admitting: Family Medicine

## 2015-05-02 VITALS — BP 140/80 | Temp 97.7°F | Ht 68.0 in | Wt 134.1 lb

## 2015-05-02 DIAGNOSIS — R21 Rash and other nonspecific skin eruption: Secondary | ICD-10-CM | POA: Diagnosis not present

## 2015-05-02 MED ORDER — PREDNISONE 20 MG PO TABS
ORAL_TABLET | ORAL | Status: DC
Start: 2015-05-02 — End: 2015-07-03

## 2015-05-02 NOTE — Progress Notes (Signed)
   Subjective:    Patient ID: Debra George, female    DOB: 01-Jul-1941, 74 y.o.   MRN: [id number]  Rash This is a new problem. The current episode started 1 to 4 weeks ago. The problem has been gradually worsening since onset. The affected locations include the left arm, left upper leg, left lower leg, right arm, right upper leg and right lowerleg. The rash is characterized by itchiness, blistering and redness. Past treatments include anti-itch cream. The treatment provided mild relief.    pretty sure she got around some poison ivy or something similar Patient states no other concerns this visit.  Review of Systems  Skin: Positive for rash.   no headache no chest pain no fever     Objective:   Physical Exam   alert vitals stable. HEENT normal. Lungs clear heart regular in rhythm diffuse patchy erythematous hypertrophic rash with  linear phenomena      Assessment & Plan:  Shoulder impression contact dermatitis plan prednisone taper. Local measures discussed warning signs discussed WSL

## 2015-06-27 ENCOUNTER — Ambulatory Visit (INDEPENDENT_AMBULATORY_CARE_PROVIDER_SITE_OTHER): Payer: Medicare Other | Admitting: *Deleted

## 2015-06-27 DIAGNOSIS — Z23 Encounter for immunization: Secondary | ICD-10-CM | POA: Diagnosis not present

## 2015-07-03 ENCOUNTER — Ambulatory Visit (INDEPENDENT_AMBULATORY_CARE_PROVIDER_SITE_OTHER): Payer: Medicare Other | Admitting: Family Medicine

## 2015-07-03 ENCOUNTER — Encounter: Payer: Self-pay | Admitting: Family Medicine

## 2015-07-03 VITALS — BP 148/92 | Temp 98.3°F | Ht 68.0 in | Wt 136.0 lb

## 2015-07-03 DIAGNOSIS — J329 Chronic sinusitis, unspecified: Secondary | ICD-10-CM

## 2015-07-03 MED ORDER — AMOXICILLIN 500 MG PO CAPS: 500.0000 mg | ORAL_CAPSULE | Freq: Three times a day (TID) | ORAL | Status: DC

## 2015-07-03 NOTE — Progress Notes (Signed)
   Subjective:    Patient ID: Debra George, female    DOB: 07-31-1941, 74 y.o.   MRN: [id number]  Cough This is a new problem. Episode onset: 3 days. Associated symptoms include a sore throat. Associated symptoms comments: Sinus drainage. Treatments tried: salt water and robitussin.    Frontal headache sore throat worse in the morn  Tried tyl   Low gr fever    Review of Systems  HENT: Positive for sore throat.   Respiratory: Positive for cough.   no stomach symptoms      Objective:   Physical Exam Alert vitals stable moderate malaise. H&T frontal mass or tenderness pharynx erythematous neck supple lungs clear heart regular rate and rhythm       Assessment & Plan:  Impression rhinosinusitis plan antibiotics prescribed. Symptom care discussed warning signs discussed WSL

## 2015-08-13 ENCOUNTER — Other Ambulatory Visit: Payer: Self-pay | Admitting: Family Medicine

## 2015-08-13 DIAGNOSIS — Z1231 Encounter for screening mammogram for malignant neoplasm of breast: Secondary | ICD-10-CM

## 2015-09-03 ENCOUNTER — Ambulatory Visit (HOSPITAL_COMMUNITY)
Admission: RE | Admit: 2015-09-03 | Discharge: 2015-09-03 | Disposition: A | Discharge status: Home / Self Care | Payer: Medicare Other | Source: Ambulatory Visit | Attending: Family Medicine | Admitting: Family Medicine

## 2015-09-03 DIAGNOSIS — Z1231 Encounter for screening mammogram for malignant neoplasm of breast: Secondary | ICD-10-CM | POA: Diagnosis not present

## 2015-09-04 ENCOUNTER — Encounter: Payer: Self-pay | Admitting: Internal Medicine

## 2015-09-12 ENCOUNTER — Telehealth: Payer: Self-pay | Admitting: Family Medicine

## 2015-09-12 NOTE — Telephone Encounter (Signed)
Ntsw. That's not correct. They directly send letters to the patients and let them know . Tell pt i dont know why that did not happen , but they are the ones who generally let folks know. Results perfect

## 2015-09-12 NOTE — Telephone Encounter (Signed)
Discussed with pt

## 2015-09-12 NOTE — Telephone Encounter (Signed)
Pt is wanting to know the results to her mammogram. Pt called over to the hospital and they told her that she needed to call us to get the results. Pt is unhappy that she is unable to get them.

## 2015-09-13 ENCOUNTER — Encounter: Payer: Self-pay | Admitting: Family Medicine

## 2015-09-13 ENCOUNTER — Ambulatory Visit (INDEPENDENT_AMBULATORY_CARE_PROVIDER_SITE_OTHER): Payer: Medicare Other | Admitting: Family Medicine

## 2015-09-13 VITALS — BP 130/84 | Ht 68.0 in | Wt 136.1 lb

## 2015-09-13 DIAGNOSIS — Z131 Encounter for screening for diabetes mellitus: Secondary | ICD-10-CM | POA: Diagnosis not present

## 2015-09-13 DIAGNOSIS — Z0000 Encounter for general adult medical examination without abnormal findings: Secondary | ICD-10-CM | POA: Diagnosis not present

## 2015-09-13 DIAGNOSIS — Z13220 Encounter for screening for lipoid disorders: Secondary | ICD-10-CM

## 2015-09-13 NOTE — Progress Notes (Signed)
   Subjective:    Patient ID: Debra George, female    DOB: 1941-03-23, 74 y.o.   MRN: [id number]  HPI AWV- Annual Wellness Visit  The patient was seen for their annual wellness visit. The patient's past medical history, surgical history, and family history were reviewed. Pertinent vaccines were reviewed ( tetanus, pneumonia, shingles, flu) The patient's medication list was reviewed and updated.  The height and weight were entered. The patient's current BMI is: 20.70  Cognitive screening was completed. Outcome of Mini - Cog: passed  Falls within the past 6 months: none  Current tobacco usage: non-smoker (All patients who use tobacco were given written and verbal information on quitting)  Recent listing of emergency department/hospitalizations over the past year were reviewed.  current specialist the patient sees on a regular basis: none   Medicare annual wellness visit patient questionnaire was reviewed.  A written screening schedule for the patient for the next 5-10 years was given. Appropriate discussion of followup regarding next visit was discussed.  Colonoscopy- 10 years ago    Mostly eating the right stuff  Already had the flu shot  And pneum      Review of Systems  Constitutional: Negative for activity change, appetite change and fatigue.  HENT: Negative for congestion, ear discharge and rhinorrhea.   Eyes: Negative for discharge.  Respiratory: Negative for cough, chest tightness and wheezing.   Cardiovascular: Negative for chest pain.  Gastrointestinal: Negative for vomiting and abdominal pain.  Genitourinary: Negative for frequency and difficulty urinating.  Musculoskeletal: Negative for neck pain.  Allergic/Immunologic: Negative for environmental allergies and food allergies.  Neurological: Negative for weakness and headaches.  Psychiatric/Behavioral: Negative for behavioral problems and agitation.  All other systems reviewed and are negative.     Objective:   Physical Exam  Constitutional: She is oriented to person, place, and time. She appears well-developed and well-nourished.  HENT:  Head: Normocephalic.  Right Ear: External ear normal.  Left Ear: External ear normal.  Eyes: Pupils are equal, round, and reactive to light.  Neck: Normal range of motion. No thyromegaly present.  Cardiovascular: Normal rate, regular rhythm, normal heart sounds and intact distal pulses.   No murmur heard. Pulmonary/Chest: Effort normal and breath sounds normal. No respiratory distress. She has no wheezes.  Abdominal: Soft. Bowel sounds are normal. She exhibits no distension and no mass. There is no tenderness.  Genitourinary: Vagina normal and uterus normal.  Musculoskeletal: Normal range of motion. She exhibits no edema or tenderness.  Lymphadenopathy:    She has no cervical adenopathy.  Neurological: She is alert and oriented to person, place, and time. She exhibits normal muscle tone.  Skin: Skin is warm and dry.  Psychiatric: She has a normal mood and affect. Her behavior is normal.  Vitals reviewed.         Assessment & Plan:  Imp well adult exam. Diet disc, exercise disc, pt to sched colonoscopy. Vaccines disc, mammo reviewed

## 2015-09-14 DIAGNOSIS — Z131 Encounter for screening for diabetes mellitus: Secondary | ICD-10-CM | POA: Diagnosis not present

## 2015-09-14 DIAGNOSIS — Z0000 Encounter for general adult medical examination without abnormal findings: Secondary | ICD-10-CM | POA: Diagnosis not present

## 2015-09-14 DIAGNOSIS — Z13220 Encounter for screening for lipoid disorders: Secondary | ICD-10-CM | POA: Diagnosis not present

## 2015-09-15 LAB — LIPID PANEL
CHOLESTEROL TOTAL: 212 mg/dL — AB (ref 100–199)
Chol/HDL Ratio: 3.4 ratio units (ref 0.0–4.4)
HDL: 63 mg/dL (ref >39)
LDL CALC: 128 mg/dL — AB (ref 0–99)
Triglycerides: 103 mg/dL (ref 0–149)
VLDL CHOLESTEROL CAL: 21 mg/dL (ref 5–40)

## 2015-09-15 LAB — GLUCOSE, RANDOM: GLUCOSE: 91 mg/dL (ref 65–99)

## 2015-09-17 ENCOUNTER — Encounter: Payer: Self-pay | Admitting: Family Medicine

## 2015-10-02 ENCOUNTER — Ambulatory Visit (INDEPENDENT_AMBULATORY_CARE_PROVIDER_SITE_OTHER): Payer: Medicare Other | Admitting: Gastroenterology

## 2015-10-02 ENCOUNTER — Other Ambulatory Visit: Payer: Self-pay

## 2015-10-02 ENCOUNTER — Encounter: Payer: Self-pay | Admitting: Gastroenterology

## 2015-10-02 VITALS — BP 155/74 | HR 81 | Temp 97.6°F | Ht 68.0 in | Wt 137.0 lb

## 2015-10-02 DIAGNOSIS — Z86010 Personal history of colonic polyps: Secondary | ICD-10-CM

## 2015-10-02 DIAGNOSIS — Z860101 Personal history of adenomatous and serrated colon polyps: Secondary | ICD-10-CM | POA: Insufficient documentation

## 2015-10-02 MED ORDER — PEG 3350-KCL-NA BICARB-NACL 420 G PO SOLR: 4000.0000 mL | ORAL | Status: DC

## 2015-10-02 NOTE — Progress Notes (Signed)
cc'ed to pcp °

## 2015-10-02 NOTE — Progress Notes (Signed)
Primary Care Physician:  Mickie Hillier, MD  Primary Gastroenterologist:  Garfield Cornea, MD   Chief Complaint  Patient presents with  . Follow-up    HPI:  Debra George is a 75 y.o. female here for surveillance colonoscopy. 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 There was no evidence of high-grade dysplasia or malignancy. Her last surveillance colonoscopy was in 2012. She had minimally adenomatous appearing mucosa at the anastomosis the small bowel. Biopsies were unremarkable.  Patient clinically has been doing very well. She is very active. Weight is stable. Denies upper GI symptoms. No abdominal pain. BM regular. No melena or rectal bleeding.   Current Outpatient Prescriptions  Medication Sig Dispense Refill  . calcium-vitamin D (OSCAL WITH D) 500-200 MG-UNIT per tablet Take 1 tablet by mouth 2 (two) times daily.      . Multiple Vitamins-Minerals (PRESERVISION/LUTEIN) CAPS Take 1 capsule by mouth 2 (two) times daily.       No current facility-administered medications for this visit.    Allergies as of 10/02/2015  . (No Known Allergies)    Past Medical History  Diagnosis Date  . Macular degeneration     Past Surgical History  Procedure Laterality Date  . Colonoscopy  09/03/2005    RMR: normal rectum/dimintive polyp at the hepatic flexure cold bx  . Colon surgery  2005    villous adenoma  . Appendectomy    . Cesarean section    . Tubal ligation    . Colonoscopy  2012    adenomatous appearing anastomosis but unremarkable biopsy    Family History  Problem Relation Age of Onset  . Colon cancer Neg Hx     Social History   Social History  . Marital Status: Married    Spouse Name: N/A  . Number of Children: 1  . Years of Education: N/A   Occupational History  . Not on file.   Social History Main Topics  . Smoking status: Former Research scientist (life sciences)  . Smokeless tobacco: Not on file     Comment: social smoker, quit 1990  . Alcohol Use: No   . Drug Use: No  . Sexual Activity: Not on file   Other Topics Concern  . Not on file   Social History Narrative      ROS:  General: Negative for anorexia, weight loss, fever, chills, fatigue, weakness. Eyes: Negative for vision changes.  ENT: Negative for hoarseness, difficulty swallowing , nasal congestion. CV: Negative for chest pain, angina, palpitations, dyspnea on exertion, peripheral edema.  Respiratory: Negative for dyspnea at rest, dyspnea on exertion, cough, sputum, wheezing.  GI: See history of present illness. GU:  Negative for dysuria, hematuria, urinary incontinence, urinary frequency, nocturnal urination.  MS: Negative for joint pain, low back pain.  Derm: Negative for rash or itching.  Neuro: Negative for weakness, abnormal sensation, seizure, frequent headaches, memory loss, confusion.  Psych: Negative for anxiety, depression, suicidal ideation, hallucinations.  Endo: Negative for unusual weight change.  Heme: Negative for bruising or bleeding. Allergy: Negative for rash or hives.    Physical Examination:  BP 155/74 mmHg  Pulse 81  Temp(Src) 97.6 F (36.4 C) (Oral)  Ht 5\' 8"  (1.727 m)  Wt 137 lb (62.143 kg)  BMI 20.84 kg/m2   General: Well-nourished, well-developed in no acute distress.  Head: Normocephalic, atraumatic.   Eyes: Conjunctiva pink, no icterus. Mouth: Oropharyngeal mucosa moist and pink , no lesions erythema or exudate. Neck: Supple without thyromegaly, masses, or lymphadenopathy.  Lungs:  Clear to auscultation bilaterally.  Heart: Regular rate and rhythm, no murmurs rubs or gallops.  Abdomen: Bowel sounds are normal, nontender, nondistended, no hepatosplenomegaly or masses, no abdominal bruits or    hernia , no rebound or guarding.   Rectal: Not performed Extremities: No lower extremity edema. No clubbing or deformities.  Neuro: Alert and oriented x 4 , grossly normal neurologically.  Skin: Warm and dry, no rash or jaundice.   Psych:  Alert and cooperative, normal mood and affect.    Imaging Studies: Mm Screening Breast Tomo Bilateral  09/04/2015  CLINICAL DATA:  Screening. EXAM: DIGITAL SCREENING BILATERAL MAMMOGRAM WITH 3D TOMO WITH CAD COMPARISON:  Previous exam(s). ACR Breast Density Category c: The breast tissue is heterogeneously dense, which may obscure small masses. FINDINGS: There are no findings suspicious for malignancy. Images were processed with CAD. IMPRESSION: No mammographic evidence of malignancy. A result letter of this screening mammogram will be mailed directly to the patient. RECOMMENDATION: Screening mammogram in one year. (Code:SM-B-01Y) BI-RADS CATEGORY  1: Negative. Electronically Signed   By: Franki Cabot M.D.   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 Last colonoscopy 2012. Clinically doing well.  I have discussed the risks, alternatives, benefits with regards to but not limited to the risk of reaction to medication, bleeding, infection, perforation and the patient is agreeable to proceed. Written consent to be obtained.

## 2015-10-02 NOTE — Patient Instructions (Signed)
1. Colonoscopy in near future with Dr. Gala Romney. See separate instructions.

## 2015-10-11 ENCOUNTER — Encounter (HOSPITAL_COMMUNITY)
Admission: RE | Disposition: A | Discharge status: Home / Self Care | Payer: Self-pay | Source: Ambulatory Visit | Attending: Internal Medicine

## 2015-10-11 ENCOUNTER — Encounter (HOSPITAL_COMMUNITY): Payer: Self-pay | Admitting: *Deleted

## 2015-10-11 ENCOUNTER — Ambulatory Visit (HOSPITAL_COMMUNITY)
Admission: RE | Admit: 2015-10-11 | Discharge: 2015-10-11 | Disposition: A | Discharge status: Home / Self Care | Payer: Medicare Other | Source: Ambulatory Visit | Attending: Internal Medicine | Admitting: Internal Medicine

## 2015-10-11 DIAGNOSIS — Z87891 Personal history of nicotine dependence: Secondary | ICD-10-CM | POA: Insufficient documentation

## 2015-10-11 DIAGNOSIS — Z1211 Encounter for screening for malignant neoplasm of colon: Secondary | ICD-10-CM | POA: Insufficient documentation

## 2015-10-11 DIAGNOSIS — D128 Benign neoplasm of rectum: Secondary | ICD-10-CM | POA: Diagnosis not present

## 2015-10-11 DIAGNOSIS — Z86010 Personal history of colonic polyps: Secondary | ICD-10-CM | POA: Diagnosis not present

## 2015-10-11 DIAGNOSIS — D123 Benign neoplasm of transverse colon: Secondary | ICD-10-CM | POA: Diagnosis not present

## 2015-10-11 DIAGNOSIS — Z9049 Acquired absence of other specified parts of digestive tract: Secondary | ICD-10-CM | POA: Insufficient documentation

## 2015-10-11 HISTORY — PX: COLONOSCOPY: SHX5424

## 2015-10-11 SURGERY — COLONOSCOPY
Anesthesia: Moderate Sedation

## 2015-10-11 MED ORDER — MEPERIDINE HCL 100 MG/ML IJ SOLN
INTRAMUSCULAR | Status: AC
  Filled 2015-10-11: qty 2

## 2015-10-11 MED ORDER — ONDANSETRON HCL 4 MG/2ML IJ SOLN
INTRAMUSCULAR | Status: AC
  Filled 2015-10-11: qty 2

## 2015-10-11 MED ORDER — ONDANSETRON HCL 4 MG/2ML IJ SOLN
INTRAMUSCULAR | Status: DC | PRN
  Administered 2015-10-11: 4 mg via INTRAVENOUS

## 2015-10-11 MED ORDER — MIDAZOLAM HCL 5 MG/5ML IJ SOLN
INTRAMUSCULAR | Status: AC
  Filled 2015-10-11: qty 10

## 2015-10-11 MED ORDER — STERILE WATER FOR IRRIGATION IR SOLN
Status: DC | PRN
  Administered 2015-10-11: 14:00:00

## 2015-10-11 MED ORDER — SODIUM CHLORIDE 0.9 % IV SOLN
INTRAVENOUS | Status: DC
  Administered 2015-10-11: 1000 mL via INTRAVENOUS

## 2015-10-11 MED ORDER — MIDAZOLAM HCL 5 MG/5ML IJ SOLN
INTRAMUSCULAR | Status: DC | PRN
  Administered 2015-10-11 (×2): 1 mg via INTRAVENOUS
  Administered 2015-10-11 (×2): 2 mg via INTRAVENOUS

## 2015-10-11 MED ORDER — MEPERIDINE HCL 100 MG/ML IJ SOLN
INTRAMUSCULAR | Status: DC | PRN
  Administered 2015-10-11 (×3): 25 mg via INTRAVENOUS

## 2015-10-11 NOTE — Discharge Instructions (Signed)
Colonoscopy Discharge Instructions  Read the instructions outlined below and refer to this sheet in the next few weeks. These discharge instructions provide you with general information on caring for yourself after you leave the hospital. Your doctor may also give you specific instructions. While your treatment has been planned according to the most current medical practices available, unavoidable complications occasionally occur. If you have any problems or questions after discharge, call Dr. Gala Romney at [phone]. ACTIVITY  You may resume your regular activity, but move at a slower pace for the next 24 hours.   Take frequent rest periods for the next 24 hours.   Walking will help get rid of the air and reduce the bloated feeling in your belly (abdomen).   No driving for 24 hours (because of the medicine (anesthesia) used during the test).    Do not sign any important legal documents or operate any machinery for 24 hours (because of the anesthesia used during the test).  NUTRITION  Drink plenty of fluids.   You may resume your normal diet as instructed by your doctor.   Begin with a light meal and progress to your normal diet. Heavy or fried foods are harder to digest and may make you feel sick to your stomach (nauseated).   Avoid alcoholic beverages for 24 hours or as instructed.  MEDICATIONS  You may resume your normal medications unless your doctor tells you otherwise.  WHAT YOU CAN EXPECT TODAY  Some feelings of bloating in the abdomen.   Passage of more gas than usual.   Spotting of blood in your stool or on the toilet paper.  IF YOU HAD POLYPS REMOVED DURING THE COLONOSCOPY:  No aspirin products for 7 days or as instructed.   No alcohol for 7 days or as instructed.   Eat a soft diet for the next 24 hours.  FINDING OUT THE RESULTS OF YOUR TEST Not all test results are available during your visit. If your test results are not back during the visit, make an appointment  with your caregiver to find out the results. Do not assume everything is normal if you have not heard from your caregiver or the medical facility. It is important for you to follow up on all of your test results.  SEEK IMMEDIATE MEDICAL ATTENTION IF:  You have more than a spotting of blood in your stool.   Your belly is swollen (abdominal distention).   You are nauseated or vomiting.   You have a temperature over 101.   You have abdominal pain or discomfort that is severe or gets worse throughout the day.   Colon polyp information provided  Further recommendations to follow pending review of the pathology report   Colon Polyps Polyps are lumps of extra tissue growing inside the body. Polyps can grow in the large intestine (colon). Most colon polyps are noncancerous (benign). However, some colon polyps can become cancerous over time. Polyps that are larger than a pea may be harmful. To be safe, caregivers remove and test all polyps. CAUSES  Polyps form when mutations in the genes cause your cells to grow and divide even though no more tissue is needed. RISK FACTORS There are a number of risk factors that can increase your chances of getting colon polyps. They include:  Being older than 50 years.  Family history of colon polyps or colon cancer.  Long-term colon diseases, such as colitis or Crohn disease.  Being overweight.  Smoking.  Being inactive.  Drinking too much  alcohol. SYMPTOMS  Most small polyps do not cause symptoms. If symptoms are present, they may include:  Blood in the stool. The stool may look dark red or black.  Constipation or diarrhea that lasts longer than 1 week. DIAGNOSIS People often do not know they have polyps until their caregiver finds them during a regular checkup. Your caregiver can use 4 tests to check for polyps:  Digital rectal exam. The caregiver wears gloves and feels inside the rectum. This test would find polyps only in the  rectum.  Barium enema. The caregiver puts a liquid called barium into your rectum before taking X-rays of your colon. Barium makes your colon look white. Polyps are dark, so they are easy to see in the X-ray pictures.  Sigmoidoscopy. A thin, flexible tube (sigmoidoscope) is placed into your rectum. The sigmoidoscope has a light and tiny camera in it. The caregiver uses the sigmoidoscope to look at the last third of your colon.  Colonoscopy. This test is like sigmoidoscopy, but the caregiver looks at the entire colon. This is the most common method for finding and removing polyps. TREATMENT  Any polyps will be removed during a sigmoidoscopy or colonoscopy. The polyps are then tested for cancer. PREVENTION  To help lower your risk of getting more colon polyps:  Eat plenty of fruits and vegetables. Avoid eating fatty foods.  Do not smoke.  Avoid drinking alcohol.  Exercise every day.  Lose weight if recommended by your caregiver.  Eat plenty of calcium and folate. Foods that are rich in calcium include milk, cheese, and broccoli. Foods that are rich in folate include chickpeas, kidney beans, and spinach. HOME CARE INSTRUCTIONS Keep all follow-up appointments as directed by your caregiver. You may need periodic exams to check for polyps. SEEK MEDICAL CARE IF: You notice bleeding during a bowel movement.   This information is not intended to replace advice given to you by your health care provider. Make sure you discuss any questions you have with your health care provider.   Document Released: 05/28/2004 Document Revised: 09/22/2014 Document Reviewed: 11/11/2011 Elsevier Interactive Patient Education [employer].

## 2015-10-11 NOTE — Interval H&P Note (Signed)
History and Physical Interval Note:  10/11/2015 1:36 PM  [name]  has presented today for surgery, with the diagnosis of history of polyps  The various methods of treatment have been discussed with the patient and family. After consideration of risks, benefits and other options for treatment, the patient has consented to  Procedure(s) with comments: COLONOSCOPY (N/A) - 200 as a surgical intervention .  The patient's history has been reviewed, patient examined, no change in status, stable for surgery.  I have reviewed the patient's chart and labs.  Questions were answered to the patient's satisfaction.     Debra George  No change. Surveillance colonoscopy as planned.  The risks, benefits, limitations, alternatives and imponderables have been reviewed with the patient. Questions have been answered. All parties are agreeable.

## 2015-10-11 NOTE — H&P (View-Only) (Signed)
Primary Care Physician:  Mickie Hillier, MD  Primary Gastroenterologist:  Garfield Cornea, MD   Chief Complaint  Patient presents with  . Follow-up    HPI:  Debra George is a 75 y.o. female here for surveillance colonoscopy. 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 There was no evidence of high-grade dysplasia or malignancy. Her last surveillance colonoscopy was in 2012. She had minimally adenomatous appearing mucosa at the anastomosis the small bowel. Biopsies were unremarkable.  Patient clinically has been doing very well. She is very active. Weight is stable. Denies upper GI symptoms. No abdominal pain. BM regular. No melena or rectal bleeding.   Current Outpatient Prescriptions  Medication Sig Dispense Refill  . calcium-vitamin D (OSCAL WITH D) 500-200 MG-UNIT per tablet Take 1 tablet by mouth 2 (two) times daily.      . Multiple Vitamins-Minerals (PRESERVISION/LUTEIN) CAPS Take 1 capsule by mouth 2 (two) times daily.       No current facility-administered medications for this visit.    Allergies as of 10/02/2015  . (No Known Allergies)    Past Medical History  Diagnosis Date  . Macular degeneration     Past Surgical History  Procedure Laterality Date  . Colonoscopy  09/03/2005    RMR: normal rectum/dimintive polyp at the hepatic flexure cold bx  . Colon surgery  2005    villous adenoma  . Appendectomy    . Cesarean section    . Tubal ligation    . Colonoscopy  2012    adenomatous appearing anastomosis but unremarkable biopsy    Family History  Problem Relation Age of Onset  . Colon cancer Neg Hx     Social History   Social History  . Marital Status: Married    Spouse Name: N/A  . Number of Children: 1  . Years of Education: N/A   Occupational History  . Not on file.   Social History Main Topics  . Smoking status: Former Research scientist (life sciences)  . Smokeless tobacco: Not on file     Comment: social smoker, quit 1990  . Alcohol Use: No   . Drug Use: No  . Sexual Activity: Not on file   Other Topics Concern  . Not on file   Social History Narrative      ROS:  General: Negative for anorexia, weight loss, fever, chills, fatigue, weakness. Eyes: Negative for vision changes.  ENT: Negative for hoarseness, difficulty swallowing , nasal congestion. CV: Negative for chest pain, angina, palpitations, dyspnea on exertion, peripheral edema.  Respiratory: Negative for dyspnea at rest, dyspnea on exertion, cough, sputum, wheezing.  GI: See history of present illness. GU:  Negative for dysuria, hematuria, urinary incontinence, urinary frequency, nocturnal urination.  MS: Negative for joint pain, low back pain.  Derm: Negative for rash or itching.  Neuro: Negative for weakness, abnormal sensation, seizure, frequent headaches, memory loss, confusion.  Psych: Negative for anxiety, depression, suicidal ideation, hallucinations.  Endo: Negative for unusual weight change.  Heme: Negative for bruising or bleeding. Allergy: Negative for rash or hives.    Physical Examination:  BP 155/74 mmHg  Pulse 81  Temp(Src) 97.6 F (36.4 C) (Oral)  Ht 5\' 8"  (1.727 m)  Wt 137 lb (62.143 kg)  BMI 20.84 kg/m2   General: Well-nourished, well-developed in no acute distress.  Head: Normocephalic, atraumatic.   Eyes: Conjunctiva pink, no icterus. Mouth: Oropharyngeal mucosa moist and pink , no lesions erythema or exudate. Neck: Supple without thyromegaly, masses, or lymphadenopathy.  Lungs:  Clear to auscultation bilaterally.  Heart: Regular rate and rhythm, no murmurs rubs or gallops.  Abdomen: Bowel sounds are normal, nontender, nondistended, no hepatosplenomegaly or masses, no abdominal bruits or    hernia , no rebound or guarding.   Rectal: Not performed Extremities: No lower extremity edema. No clubbing or deformities.  Neuro: Alert and oriented x 4 , grossly normal neurologically.  Skin: Warm and dry, no rash or jaundice.   Psych:  Alert and cooperative, normal mood and affect.    Imaging Studies: Mm Screening Breast Tomo Bilateral  09/04/2015  CLINICAL DATA:  Screening. EXAM: DIGITAL SCREENING BILATERAL MAMMOGRAM WITH 3D TOMO WITH CAD COMPARISON:  Previous exam(s). ACR Breast Density Category c: The breast tissue is heterogeneously dense, which may obscure small masses. FINDINGS: There are no findings suspicious for malignancy. Images were processed with CAD. IMPRESSION: No mammographic evidence of malignancy. A result letter of this screening mammogram will be mailed directly to the patient. RECOMMENDATION: Screening mammogram in one year. (Code:SM-B-01Y) BI-RADS CATEGORY  1: Negative. Electronically Signed   By: Franki Cabot M.D.   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 Last colonoscopy 2012. Clinically doing well.  I have discussed the risks, alternatives, benefits with regards to but not limited to the risk of reaction to medication, bleeding, infection, perforation and the patient is agreeable to proceed. Written consent to be obtained.

## 2015-10-11 NOTE — Op Note (Signed)
[facility] [address] [place], [address]   COLONOSCOPY PROCEDURE REPORT  PATIENT: Debra George, Debra George  MR#: [id number] BIRTHDATE: 1941/06/30 , 74  yrs. old GENDER: female ENDOSCOPIST: R.  Garfield Cornea, MD FACP [facility] REFERRED [id number] Wolfgang Phoenix, M.D. PROCEDURE DATE:  Oct 12, 2015 PROCEDURE:   Colonoscopy with snare polypectomy INDICATIONS:Surveillance examination; history of advanced adenoma; status post right hemicolectomy. MEDICATIONS: Versed 6 mg IV and Demerol 25 mg IV in divided doses. Zofran 4 mg IV. ASA CLASS:       Class II  CONSENT: The risks, benefits, alternatives and imponderables including but not limited to bleeding, perforation as well as the possibility of a missed lesion have been reviewed.  The potential for biopsy, lesion removal, etc. have also been discussed. Questions have been answered.  All parties agreeable.  Please see the history and physical in the medical record for more information.  DESCRIPTION OF PROCEDURE:   After the risks benefits and alternatives of the procedure were thoroughly explained, informed consent was obtained.  The digital rectal exam revealed no abnormalities of the rectum.   The [id number]  endoscope was introduced through the anus and advanced to the surgical anastomosis. No adverse events experienced.   The quality of the prep was adequate  The instrument was then slowly withdrawn as the colon was fully examined. Estimated blood loss is zero unless otherwise noted in this procedure report.      COLON FINDINGS: (1) 8 mm pedunculated polyp in the rectum at 10 cm with an adjacent 4 mm today, otherwise, normal-appearing rectal mucosa.  At the splenic flexure, there was a 1 cm sessile polyp and an adjacent 4 mm polyp.  Surgical anastomosis identified.  The remainder of the residual colonic mucosa appeared normal.  Above polyps were removed with hot and cold snare polypectomy technique.  Retroflexion  was performed. .  Withdrawal time=7 minutes 0 seconds.  The scope was withdrawn and the procedure completed. COMPLICATIONS: There were no immediate complications.  ENDOSCOPIC IMPRESSION: Multiple rectal and colonic polyps?"removed as described above. Status post right hemicolectomy  RECOMMENDATIONS: Follow up on pathology.  eSigned:  R. Garfield Cornea, MD Rosalita Chessman Marval Regal 12-Oct-2015 2:12 PM   cc:  CPT CODES: ICD CODES:  The ICD and CPT codes recommended by this software are interpretations from the data that the clinical staff has captured with the software.  The verification of the translation of this report to the ICD and CPT codes and modifiers is the sole responsibility of the health care institution and practicing physician where this report was generated.  [employer] will not be held responsible for the validity of the ICD and CPT codes included on this report.  AMA assumes no liability for data contained or not contained herein. CPT is a Designer, television/film set of the [employer].

## 2015-10-15 ENCOUNTER — Encounter: Payer: Self-pay | Admitting: Internal Medicine

## 2015-10-16 ENCOUNTER — Encounter (HOSPITAL_COMMUNITY): Payer: Self-pay | Admitting: Internal Medicine

## 2015-11-13 DIAGNOSIS — H2512 Age-related nuclear cataract, left eye: Secondary | ICD-10-CM | POA: Diagnosis not present

## 2015-11-13 DIAGNOSIS — H25013 Cortical age-related cataract, bilateral: Secondary | ICD-10-CM | POA: Diagnosis not present

## 2015-11-13 DIAGNOSIS — H2513 Age-related nuclear cataract, bilateral: Secondary | ICD-10-CM | POA: Diagnosis not present

## 2015-11-13 DIAGNOSIS — H25012 Cortical age-related cataract, left eye: Secondary | ICD-10-CM | POA: Diagnosis not present

## 2015-11-19 NOTE — Patient Instructions (Signed)
Debra George  11/19/2015     @PREFPERIOPPHARMACY @   Your procedure is scheduled on 11/27/2015.  Report to [facility] at 7:00 A.M.  Call this number if you have problems the morning of surgery:  [phone]   Remember:  Do not eat food or drink liquids after midnight.  Take these medicines the morning of surgery with A SIP OF WATER NONE   Do not wear jewelry, make-up or nail polish.  Do not wear lotions, powders, or perfumes.  You may wear deodorant.  Do not shave 48 hours prior to surgery.  Men may shave face and neck.  Do not bring valuables to the hospital.  [facility] is not responsible for any belongings or valuables.  Contacts, dentures or bridgework may not be worn into surgery.  Leave your suitcase in the car.  After surgery it may be brought to your room.  For patients admitted to the hospital, discharge time will be determined by your treatment team.  Patients discharged the day of surgery will not be allowed to drive home.    Please read over the following fact sheets that you were given. Anesthesia Post-op Instructions     PATIENT INSTRUCTIONS POST-ANESTHESIA  IMMEDIATELY FOLLOWING SURGERY:  Do not drive or operate machinery for the first twenty four hours after surgery.  Do not make any important decisions for twenty four hours after surgery or while taking narcotic pain medications or sedatives.  If you develop intractable nausea and vomiting or a severe headache please notify your doctor immediately.  FOLLOW-UP:  Please make an appointment with your surgeon as instructed. You do not need to follow up with anesthesia unless specifically instructed to do so.  WOUND CARE INSTRUCTIONS (if applicable):  Keep a dry clean dressing on the anesthesia/puncture wound site if there is drainage.  Once the wound has quit draining you may leave it open to air.  Generally you should leave the bandage intact for twenty four hours unless there is drainage.  If the  epidural site drains for more than 36-48 hours please call the anesthesia department.  QUESTIONS?:  Please feel free to call your physician or the hospital operator if you have any questions, and they will be happy to assist you.       A cataract is a clouding of the lens of the eye. When a lens becomes cloudy, vision is reduced based on the degree and nature of the clouding. Surgery may be needed to improve vision. Surgery removes the cloudy lens and usually replaces it with a substitute lens (intraocular lens, IOL). LET YOUR EYE DOCTOR KNOW ABOUT:  Allergies to food or medicine.  Medicines taken including herbs, eye drops, over-the-counter medicines, and creams.  Use of steroids (by mouth or creams).  Previous problems with anesthetics or numbing medicine.  History of bleeding problems or blood clots.  Previous surgery.  Other health problems, including diabetes and kidney problems.  Possibility of pregnancy, if this applies. RISKS AND COMPLICATIONS  Infection.  Inflammation of the eyeball (endophthalmitis) that can spread to both eyes (sympathetic ophthalmia).  Poor wound healing.  If an IOL is inserted, it can later fall out of proper position. This is very uncommon.  Clouding of the part of your eye that holds an IOL in place. This is called an "after-cataract." These are uncommon but easily treated. BEFORE THE PROCEDURE  Do not eat or drink anything except small amounts of water for 8 to 12 before your surgery, or  as directed by your caregiver.  Unless you are told otherwise, continue any eye drops you have been prescribed.  Talk to your primary caregiver about all other medicines that you take (both prescription and nonprescription). In some cases, you may need to stop or change medicines near the time of your surgery. This is most important if you are taking blood-thinning medicine.Do not stop medicines unless you are told to do so.  Arrange for someone to drive  you to and from the procedure.  Do not put contact lenses in either eye on the day of your surgery. PROCEDURE There is more than one method for safely removing a cataract. Your doctor can explain the differences and help determine which is best for you. Phacoemulsification surgery is the most common form of cataract surgery.  An injection is given behind the eye or eye drops are given to make this a painless procedure.  A small cut (incision) is made on the edge of the clear, dome-shaped surface that covers the front of the eye (cornea).  A tiny probe is painlessly inserted into the eye. This device gives off ultrasound waves that soften and break up the cloudy center of the lens. This makes it easier for the cloudy lens to be removed by suction.  An IOL may be implanted.  The normal lens of the eye is covered by a clear capsule. Part of that capsule is intentionally left in the eye to support the IOL.  Your surgeon may or may not use stitches to close the incision. There are other forms of cataract surgery that require a larger incision and stitches to close the eye. This approach is taken in cases where the doctor feels that the cataract cannot be easily removed using phacoemulsification. AFTER THE PROCEDURE  When an IOL is implanted, it does not need care. It becomes a permanent part of your eye and cannot be seen or felt.  Your doctor will schedule follow-up exams to check on your progress.  Review your other medicines with your doctor to see which can be resumed after surgery.  Use eye drops or take medicine as prescribed by your doctor.   This information is not intended to replace advice given to you by your health care provider. Make sure you discuss any questions you have with your health care provider.   Document Released: 08/21/2011 Document Revised: 09/22/2014 Document Reviewed: 08/21/2011 Elsevier Interactive Patient Education [employer].

## 2015-11-20 ENCOUNTER — Encounter (HOSPITAL_COMMUNITY)
Admission: RE | Admit: 2015-11-20 | Discharge: 2015-11-20 | Disposition: A | Discharge status: Home / Self Care | Payer: Medicare Other | Source: Ambulatory Visit | Attending: Ophthalmology | Admitting: Ophthalmology

## 2015-11-20 ENCOUNTER — Other Ambulatory Visit: Payer: Self-pay

## 2015-11-20 ENCOUNTER — Encounter (HOSPITAL_COMMUNITY): Payer: Self-pay

## 2015-11-20 DIAGNOSIS — Z01810 Encounter for preprocedural cardiovascular examination: Secondary | ICD-10-CM | POA: Diagnosis not present

## 2015-11-20 DIAGNOSIS — Z01812 Encounter for preprocedural laboratory examination: Secondary | ICD-10-CM | POA: Insufficient documentation

## 2015-11-20 LAB — CBC
HCT: 39.8 % (ref 36.0–46.0)
Hemoglobin: 13.7 g/dL (ref 12.0–15.0)
MCH: 31.9 pg (ref 26.0–34.0)
MCHC: 34.4 g/dL (ref 30.0–36.0)
MCV: 92.8 fL (ref 78.0–100.0)
PLATELETS: 158 10*3/uL (ref 150–400)
RBC: 4.29 MIL/uL (ref 3.87–5.11)
RDW: 13.4 % (ref 11.5–15.5)
WBC: 6.4 10*3/uL (ref 4.0–10.5)

## 2015-11-20 LAB — BASIC METABOLIC PANEL
ANION GAP: 6 (ref 5–15)
BUN: 13 mg/dL (ref 6–20)
CALCIUM: 9.2 mg/dL (ref 8.9–10.3)
CO2: 31 mmol/L (ref 22–32)
CREATININE: 0.69 mg/dL (ref 0.44–1.00)
Chloride: 104 mmol/L (ref 101–111)
GFR calc Af Amer: >60 mL/min (ref >60)
GLUCOSE: 112 mg/dL — AB (ref 65–99)
Potassium: 3.7 mmol/L (ref 3.5–5.1)
Sodium: 141 mmol/L (ref 135–145)

## 2015-11-27 ENCOUNTER — Ambulatory Visit (HOSPITAL_COMMUNITY)
Admission: RE | Admit: 2015-11-27 | Discharge: 2015-11-27 | Disposition: A | Discharge status: Home / Self Care | Payer: Medicare Other | Source: Ambulatory Visit | Attending: Ophthalmology | Admitting: Ophthalmology

## 2015-11-27 ENCOUNTER — Ambulatory Visit (HOSPITAL_COMMUNITY): Payer: Medicare Other | Admitting: Anesthesiology

## 2015-11-27 ENCOUNTER — Encounter (HOSPITAL_COMMUNITY): Payer: Self-pay | Admitting: *Deleted

## 2015-11-27 ENCOUNTER — Encounter (HOSPITAL_COMMUNITY)
Admission: RE | Disposition: A | Discharge status: Home / Self Care | Payer: Self-pay | Source: Ambulatory Visit | Attending: Ophthalmology

## 2015-11-27 DIAGNOSIS — Z87891 Personal history of nicotine dependence: Secondary | ICD-10-CM | POA: Diagnosis not present

## 2015-11-27 DIAGNOSIS — H2511 Age-related nuclear cataract, right eye: Secondary | ICD-10-CM | POA: Diagnosis not present

## 2015-11-27 DIAGNOSIS — H2512 Age-related nuclear cataract, left eye: Secondary | ICD-10-CM | POA: Insufficient documentation

## 2015-11-27 DIAGNOSIS — H269 Unspecified cataract: Secondary | ICD-10-CM | POA: Diagnosis not present

## 2015-11-27 DIAGNOSIS — H25011 Cortical age-related cataract, right eye: Secondary | ICD-10-CM | POA: Diagnosis not present

## 2015-11-27 DIAGNOSIS — H25012 Cortical age-related cataract, left eye: Secondary | ICD-10-CM | POA: Diagnosis not present

## 2015-11-27 HISTORY — PX: CATARACT EXTRACTION W/PHACO: SHX586

## 2015-11-27 SURGERY — PHACOEMULSIFICATION, CATARACT, WITH IOL INSERTION
Anesthesia: Monitor Anesthesia Care | Site: Eye | Laterality: Left

## 2015-11-27 MED ORDER — PHENYLEPHRINE HCL 2.5 % OP SOLN
1.0000 [drp] | OPHTHALMIC | Status: AC
  Administered 2015-11-27 (×3): 1 [drp] via OPHTHALMIC

## 2015-11-27 MED ORDER — TETRACAINE 0.5 % OP SOLN OPTIME - NO CHARGE
OPHTHALMIC | Status: DC | PRN
  Administered 2015-11-27: 2 [drp] via OPHTHALMIC

## 2015-11-27 MED ORDER — CYCLOPENTOLATE-PHENYLEPHRINE 0.2-1 % OP SOLN
1.0000 [drp] | OPHTHALMIC | Status: AC
  Administered 2015-11-27 (×3): 1 [drp] via OPHTHALMIC

## 2015-11-27 MED ORDER — MIDAZOLAM HCL 2 MG/2ML IJ SOLN
INTRAMUSCULAR | Status: AC
Start: 2015-11-27 — End: 2015-11-27
  Filled 2015-11-27: qty 2

## 2015-11-27 MED ORDER — EPINEPHRINE HCL 1 MG/ML IJ SOLN
INTRAMUSCULAR | Status: AC
  Filled 2015-11-27: qty 1

## 2015-11-27 MED ORDER — MIDAZOLAM HCL 2 MG/2ML IJ SOLN
1.0000 mg | INTRAMUSCULAR | Status: DC | PRN
  Administered 2015-11-27: 2 mg via INTRAVENOUS

## 2015-11-27 MED ORDER — EPINEPHRINE HCL 1 MG/ML IJ SOLN
INTRAOCULAR | Status: DC | PRN
  Administered 2015-11-27: 500 mL

## 2015-11-27 MED ORDER — FENTANYL CITRATE (PF) 100 MCG/2ML IJ SOLN
INTRAMUSCULAR | Status: AC
  Filled 2015-11-27: qty 2

## 2015-11-27 MED ORDER — LACTATED RINGERS IV SOLN
INTRAVENOUS | Status: DC
  Administered 2015-11-27: 1000 mL via INTRAVENOUS

## 2015-11-27 MED ORDER — FENTANYL CITRATE (PF) 100 MCG/2ML IJ SOLN
25.0000 ug | INTRAMUSCULAR | Status: AC
  Administered 2015-11-27 (×2): 25 ug via INTRAVENOUS

## 2015-11-27 MED ORDER — PROVISC 10 MG/ML IO SOLN
INTRAOCULAR | Status: DC | PRN
  Administered 2015-11-27: 0.85 mL via INTRAOCULAR

## 2015-11-27 MED ORDER — BSS IO SOLN
INTRAOCULAR | Status: DC | PRN
  Administered 2015-11-27: 15 mL

## 2015-11-27 MED ORDER — TETRACAINE HCL 0.5 % OP SOLN
1.0000 [drp] | OPHTHALMIC | Status: AC
  Administered 2015-11-27 (×3): 1 [drp] via OPHTHALMIC

## 2015-11-27 MED ORDER — KETOROLAC TROMETHAMINE 0.5 % OP SOLN
1.0000 [drp] | OPHTHALMIC | Status: AC
  Administered 2015-11-27 (×3): 1 [drp] via OPHTHALMIC

## 2015-11-27 SURGICAL SUPPLY — 10 items
CLOTH BEACON ORANGE TIMEOUT ST (SAFETY) ×1 IMPLANT
EYE SHIELD UNIVERSAL CLEAR (GAUZE/BANDAGES/DRESSINGS) ×1 IMPLANT
GLOVE BIOGEL PI IND STRL 7.0 (GLOVE) IMPLANT
GLOVE BIOGEL PI INDICATOR 7.0 (GLOVE) ×1
GLOVE EXAM NITRILE MD LF STRL (GLOVE) ×1 IMPLANT
LENS ALC ACRYL/TECN (Ophthalmic Related) ×2 IMPLANT
PAD ARMBOARD 7.5X6 YLW CONV (MISCELLANEOUS) ×1 IMPLANT
TAPE SURG TRANSPORE 1 IN (GAUZE/BANDAGES/DRESSINGS) IMPLANT
TAPE SURGICAL TRANSPORE 1 IN (GAUZE/BANDAGES/DRESSINGS) ×1
WATER STERILE IRR 250ML POUR (IV SOLUTION) ×1 IMPLANT

## 2015-11-27 NOTE — Anesthesia Postprocedure Evaluation (Signed)
Anesthesia Post Note  Patient: [name]  Procedure(s) Performed: Procedure(s) (LRB): CATARACT EXTRACTION PHACO AND INTRAOCULAR LENS PLACEMENT (IOC) (Left)  Patient location during evaluation: Short Stay Anesthesia Type: MAC Level of consciousness: awake and alert Pain management: pain level controlled Vital Signs Assessment: post-procedure vital signs reviewed and stable Respiratory status: spontaneous breathing Cardiovascular status: blood pressure returned to baseline Postop Assessment: no signs of nausea or vomiting Anesthetic complications: no    Last Vitals:  Filed Vitals:   11/27/15 0815 11/27/15 0830  BP: 142/80   Pulse: 89   Temp:    Resp: 13 17    Last Pain: There were no vitals filed for this visit.               Mesa Janus

## 2015-11-27 NOTE — Transfer of Care (Signed)
Immediate Anesthesia Transfer of Care Note  Patient: Debra George  Procedure(s) Performed: Procedure(s) with comments: CATARACT EXTRACTION PHACO AND INTRAOCULAR LENS PLACEMENT (IOC) (Left) - CDE:10.0  Patient Location: Short Stay  Anesthesia Type:MAC  Level of Consciousness: awake  Airway & Oxygen Therapy: Patient Spontanous Breathing  Post-op Assessment: Report given to RN  Post vital signs: Reviewed  Last Vitals:  Filed Vitals:   11/27/15 0815 11/27/15 0830  BP: 142/80   Pulse: 89   Temp:    Resp: 13 17    Complications: No apparent anesthesia complications

## 2015-11-27 NOTE — Op Note (Signed)
Patient brought to the operating room and prepped and draped in the usual manner.  Lid speculum inserted in left eye.  Stab incision made at the twelve o'clock position.  Provisc instilled in the anterior chamber.   A 2.4 mm. Stab incision was made temporally.  An anterior capsulotomy was done with a bent 25 gauge needle.  The nucleus was hydrodissected.  The Phaco tip was inserted in the anterior chamber and the nucleus was emulsified.  CDE was 10.00.  The cortical material was then removed with the I and A tip.  Posterior capsule was the polished.  The anterior chamber was deepened with Provisc.  A 17.0 Diopter Hoya Model 250 IOL was then inserted in the capsular bag.  Provisc was then removed with the I and A tip.  The wound was then hydrated.  Patient sent to the Recovery Room in good condition with follow up in my office.  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

## 2015-11-27 NOTE — Anesthesia Preprocedure Evaluation (Signed)
Anesthesia Evaluation  Patient identified by MRN, date of birth, ID band Patient awake    Reviewed: Allergy & Precautions, NPO status , Patient's Chart, lab work & pertinent test results  Airway Mallampati: I  TM Distance: >3 FB     Dental  (+) Lower Dentures, Upper Dentures   Pulmonary neg pulmonary ROS, former smoker,    breath sounds clear to auscultation       Cardiovascular negative cardio ROS   Rhythm:Regular Rate:Normal     Neuro/Psych    GI/Hepatic negative GI ROS,   Endo/Other    Renal/GU      Musculoskeletal   Abdominal   Peds  Hematology   Anesthesia Other Findings White coat syndrome  Reproductive/Obstetrics                             Anesthesia Physical Anesthesia Plan  ASA: I  Anesthesia Plan: MAC   Post-op Pain Management:    Induction: Intravenous  Airway Management Planned: Nasal Cannula  Additional Equipment:   Intra-op Plan:   Post-operative Plan:   Informed Consent: I have reviewed the patients History and Physical, chart, labs and discussed the procedure including the risks, benefits and alternatives for the proposed anesthesia with the patient or authorized representative who has indicated his/her understanding and acceptance.     Plan Discussed with:   Anesthesia Plan Comments:         Anesthesia Quick Evaluation

## 2015-11-27 NOTE — H&P (Signed)
The patient was re examined and there is no change in the patients condition since the original H and P. 

## 2015-11-27 NOTE — Discharge Instructions (Signed)
°  °          [facility] Instructions °[address]- [place] [address]-[place] °    ° °1. Avoid closing eyes tightly. One often closes the eye tightly when laughing, talking, sneezing, coughing or if they feel irritated. At these times, you should be careful not to close your eyes tightly. ° °2. Instill eye drops as instructed. To instill drops in your eye, open it, look up and have someone gently pull the lower lid down and instill a couple of drops inside the lower lid. ° °3. Do not touch upper lid. ° °4. Take Advil or Tylenol for pain. ° °5. You may use either eye for near work, such as reading or sewing and you may watch television. ° °6. You may have your hair done at the beauty parlor at any time. ° °7. Wear dark glasses with or without your own glasses if you are in bright light. ° °8. Call our office at [phone] or [phone] if you have sharp pain in your eye or unusual symptoms. ° °9.  FOLLOW UP WITH DR. SHAPIRO TODAY IN HIS [place] OFFICE AT 2:45pm. ° °  °I have received a copy of the above instructions and will follow them.  ° ° ° °IF YOU ARE IN IMMEDIATE DANGER CALL 911! ° °It is important for you to keep your follow-up appointment with your physician after discharge, OR, for you /your caregiver to make a follow-up appointment with your physician / medical provider after discharge. ° °Show these instructions to the next healthcare provider you see. °PATIENT INSTRUCTIONS °POST-ANESTHESIA ° °IMMEDIATELY FOLLOWING SURGERY:  Do not drive or operate machinery for the first twenty four hours after surgery.  Do not make any important decisions for twenty four hours after surgery or while taking narcotic pain medications or sedatives.  If you develop intractable nausea and vomiting or a severe headache please notify your doctor immediately. ° °FOLLOW-UP:  Please make an appointment with your surgeon as instructed. You do not need to follow up with anesthesia unless  specifically instructed to do so. ° °WOUND CARE INSTRUCTIONS (if applicable):  Keep a dry clean dressing on the anesthesia/puncture wound site if there is drainage.  Once the wound has quit draining you may leave it open to air.  Generally you should leave the bandage intact for twenty four hours unless there is drainage.  If the epidural site drains for more than 36-48 hours please call the anesthesia department. ° °QUESTIONS?:  Please feel free to call your physician or the hospital operator if you have any questions, and they will be happy to assist you.    ° ° ° °

## 2015-11-28 ENCOUNTER — Encounter (HOSPITAL_COMMUNITY): Payer: Self-pay | Admitting: Ophthalmology

## 2015-12-06 ENCOUNTER — Encounter (HOSPITAL_COMMUNITY)
Admission: RE | Admit: 2015-12-06 | Discharge: 2015-12-06 | Disposition: A | Discharge status: Home / Self Care | Payer: Medicare Other | Source: Ambulatory Visit | Attending: Ophthalmology | Admitting: Ophthalmology

## 2015-12-06 ENCOUNTER — Encounter (HOSPITAL_COMMUNITY): Payer: Self-pay

## 2015-12-11 ENCOUNTER — Ambulatory Visit (HOSPITAL_COMMUNITY)
Admission: RE | Admit: 2015-12-11 | Discharge: 2015-12-11 | Disposition: A | Discharge status: Home / Self Care | Payer: Medicare Other | Source: Ambulatory Visit | Attending: Ophthalmology | Admitting: Ophthalmology

## 2015-12-11 ENCOUNTER — Ambulatory Visit (HOSPITAL_COMMUNITY): Payer: Medicare Other | Admitting: Anesthesiology

## 2015-12-11 ENCOUNTER — Encounter (HOSPITAL_COMMUNITY)
Admission: RE | Disposition: A | Discharge status: Home / Self Care | Payer: Self-pay | Source: Ambulatory Visit | Attending: Ophthalmology

## 2015-12-11 ENCOUNTER — Encounter (HOSPITAL_COMMUNITY): Payer: Self-pay | Admitting: *Deleted

## 2015-12-11 DIAGNOSIS — Z87891 Personal history of nicotine dependence: Secondary | ICD-10-CM | POA: Insufficient documentation

## 2015-12-11 DIAGNOSIS — H268 Other specified cataract: Secondary | ICD-10-CM | POA: Diagnosis not present

## 2015-12-11 DIAGNOSIS — H2511 Age-related nuclear cataract, right eye: Secondary | ICD-10-CM | POA: Diagnosis not present

## 2015-12-11 DIAGNOSIS — H269 Unspecified cataract: Secondary | ICD-10-CM | POA: Diagnosis not present

## 2015-12-11 DIAGNOSIS — H25011 Cortical age-related cataract, right eye: Secondary | ICD-10-CM | POA: Diagnosis not present

## 2015-12-11 HISTORY — PX: CATARACT EXTRACTION W/PHACO: SHX586

## 2015-12-11 SURGERY — PHACOEMULSIFICATION, CATARACT, WITH IOL INSERTION
Anesthesia: Monitor Anesthesia Care | Site: Eye | Laterality: Right

## 2015-12-11 MED ORDER — MIDAZOLAM HCL 2 MG/2ML IJ SOLN
INTRAMUSCULAR | Status: AC
  Filled 2015-12-11: qty 2

## 2015-12-11 MED ORDER — PHENYLEPHRINE HCL 2.5 % OP SOLN
1.0000 [drp] | OPHTHALMIC | Status: AC
  Administered 2015-12-11 (×3): 1 [drp] via OPHTHALMIC

## 2015-12-11 MED ORDER — EPINEPHRINE HCL 1 MG/ML IJ SOLN
INTRAMUSCULAR | Status: AC
  Filled 2015-12-11: qty 1

## 2015-12-11 MED ORDER — LACTATED RINGERS IV SOLN
INTRAVENOUS | Status: DC
  Administered 2015-12-11: 08:00:00 via INTRAVENOUS

## 2015-12-11 MED ORDER — ONDANSETRON HCL 4 MG/2ML IJ SOLN: 4.0000 mg | Freq: Once | INTRAMUSCULAR | Status: DC | PRN

## 2015-12-11 MED ORDER — FENTANYL CITRATE (PF) 100 MCG/2ML IJ SOLN
25.0000 ug | INTRAMUSCULAR | Status: AC
  Administered 2015-12-11: 25 ug via INTRAVENOUS

## 2015-12-11 MED ORDER — FENTANYL CITRATE (PF) 100 MCG/2ML IJ SOLN: 25.0000 ug | INTRAMUSCULAR | Status: DC | PRN

## 2015-12-11 MED ORDER — FENTANYL CITRATE (PF) 100 MCG/2ML IJ SOLN
INTRAMUSCULAR | Status: AC
  Filled 2015-12-11: qty 2

## 2015-12-11 MED ORDER — EPINEPHRINE HCL 1 MG/ML IJ SOLN
INTRAOCULAR | Status: DC | PRN
  Administered 2015-12-11: 500 mL

## 2015-12-11 MED ORDER — BSS IO SOLN
INTRAOCULAR | Status: DC | PRN
  Administered 2015-12-11: 15 mL

## 2015-12-11 MED ORDER — KETOROLAC TROMETHAMINE 0.5 % OP SOLN
1.0000 [drp] | OPHTHALMIC | Status: AC
  Administered 2015-12-11 (×3): 1 [drp] via OPHTHALMIC

## 2015-12-11 MED ORDER — PROVISC 10 MG/ML IO SOLN
INTRAOCULAR | Status: DC | PRN
  Administered 2015-12-11: 0.85 mL via INTRAOCULAR

## 2015-12-11 MED ORDER — TETRACAINE HCL 0.5 % OP SOLN
1.0000 [drp] | OPHTHALMIC | Status: AC
  Administered 2015-12-11 (×3): 1 [drp] via OPHTHALMIC

## 2015-12-11 MED ORDER — TETRACAINE 0.5 % OP SOLN OPTIME - NO CHARGE
OPHTHALMIC | Status: DC | PRN
  Administered 2015-12-11: 2 [drp] via OPHTHALMIC

## 2015-12-11 MED ORDER — MIDAZOLAM HCL 2 MG/2ML IJ SOLN
1.0000 mg | INTRAMUSCULAR | Status: DC | PRN
  Administered 2015-12-11: 2 mg via INTRAVENOUS

## 2015-12-11 MED ORDER — CYCLOPENTOLATE-PHENYLEPHRINE 0.2-1 % OP SOLN
1.0000 [drp] | OPHTHALMIC | Status: AC
  Administered 2015-12-11 (×3): 1 [drp] via OPHTHALMIC

## 2015-12-11 SURGICAL SUPPLY — 10 items
CLOTH BEACON ORANGE TIMEOUT ST (SAFETY) ×2 IMPLANT
EYE SHIELD UNIVERSAL CLEAR (GAUZE/BANDAGES/DRESSINGS) ×2 IMPLANT
GLOVE BIOGEL PI IND STRL 7.0 (GLOVE) IMPLANT
GLOVE BIOGEL PI INDICATOR 7.0 (GLOVE) ×2
GLOVE EXAM NITRILE MD LF STRL (GLOVE) ×2 IMPLANT
LENS ALC ACRYL/TECN (Ophthalmic Related) ×3 IMPLANT
PAD ARMBOARD 7.5X6 YLW CONV (MISCELLANEOUS) ×2 IMPLANT
TAPE SURG TRANSPORE 1 IN (GAUZE/BANDAGES/DRESSINGS) IMPLANT
TAPE SURGICAL TRANSPORE 1 IN (GAUZE/BANDAGES/DRESSINGS) ×2
WATER STERILE IRR 250ML POUR (IV SOLUTION) ×2 IMPLANT

## 2015-12-11 NOTE — Op Note (Signed)
Patient brought to the operating room and prepped and draped in the usual manner.  Lid speculum inserted in right eye.  Stab incision made at the twelve o'clock position.  Provisc instilled in the anterior chamber.   A 2.4 mm. Stab incision was made temporally.  An anterior capsulotomy was done with a bent 25 gauge needle.  The nucleus was hydrodissected.  The Phaco tip was inserted in the anterior chamber and the nucleus was emulsified.  CDE was 8.61.  The cortical material was then removed with the I and A tip.  Posterior capsule was the polished.  The anterior chamber was deepened with Provisc.  A 17.0 Diopter Hoya Model 250 IOL was then inserted in the capsular bag.  Provisc was then removed with the I and A tip.  The wound was then hydrated.  Patient sent to the Recovery Room in good condition with follow up in my office.  Preoperative Diagnosis:  Cortical and Nuclear Cataract OD Postoperative Diagnosis:  Same Procedure name: Kelman Phacoemulsification OD with IOL

## 2015-12-11 NOTE — Anesthesia Postprocedure Evaluation (Signed)
Anesthesia Post Note  Patient: Debra George  Procedure(s) Performed: Procedure(s) (LRB): CATARACT EXTRACTION PHACO AND INTRAOCULAR LENS PLACEMENT (IOC) (Right)  Patient location during evaluation: Short Stay Anesthesia Type: MAC Level of consciousness: awake and alert Pain management: pain level controlled Vital Signs Assessment: post-procedure vital signs reviewed and stable Respiratory status: spontaneous breathing Cardiovascular status: stable Postop Assessment: adequate PO intake Anesthetic complications: no    Last Vitals:  Filed Vitals:   12/11/15 0745 12/11/15 0750  BP: 140/72   Pulse:    Temp:    Resp: 19 23    Last Pain: There were no vitals filed for this visit.               Alexiah Koroma

## 2015-12-11 NOTE — Anesthesia Preprocedure Evaluation (Signed)
Anesthesia Evaluation  Patient identified by MRN, date of birth, ID band Patient awake    Reviewed: Allergy & Precautions, NPO status , Patient's Chart, lab work & pertinent test results  Airway Mallampati: I  TM Distance: >3 FB     Dental  (+) Lower Dentures, Upper Dentures   Pulmonary neg pulmonary ROS, former smoker,    breath sounds clear to auscultation       Cardiovascular negative cardio ROS   Rhythm:Regular Rate:Normal     Neuro/Psych    GI/Hepatic negative GI ROS,   Endo/Other    Renal/GU      Musculoskeletal   Abdominal   Peds  Hematology   Anesthesia Other Findings White coat syndrome  Reproductive/Obstetrics                             Anesthesia Physical Anesthesia Plan  ASA: I  Anesthesia Plan: MAC   Post-op Pain Management:    Induction: Intravenous  Airway Management Planned: Nasal Cannula  Additional Equipment:   Intra-op Plan:   Post-operative Plan:   Informed Consent: I have reviewed the patients History and Physical, chart, labs and discussed the procedure including the risks, benefits and alternatives for the proposed anesthesia with the patient or authorized representative who has indicated his/her understanding and acceptance.     Plan Discussed with:   Anesthesia Plan Comments:         Anesthesia Quick Evaluation

## 2015-12-11 NOTE — Transfer of Care (Signed)
Immediate Anesthesia Transfer of Care Note  Patient: Debra George  Procedure(s) Performed: Procedure(s) with comments: CATARACT EXTRACTION PHACO AND INTRAOCULAR LENS PLACEMENT (IOC) (Right) - CDE:8.61  Patient Location: Short Stay  Anesthesia Type:MAC  Level of Consciousness: awake  Airway & Oxygen Therapy: Patient Spontanous Breathing  Post-op Assessment: Report given to RN  Post vital signs: Reviewed  Last Vitals:  Filed Vitals:   12/11/15 0745 12/11/15 0750  BP: 140/72   Pulse:    Temp:    Resp: 19 23    Complications: No apparent anesthesia complications

## 2015-12-11 NOTE — H&P (Signed)
The patient was re examined and there is no change in the patients condition since the original H and P. 

## 2015-12-11 NOTE — Discharge Instructions (Signed)
°  °          [facility] Instructions °[address]- [place] [address]-[place] °    ° °1. Avoid closing eyes tightly. One often closes the eye tightly when laughing, talking, sneezing, coughing or if they feel irritated. At these times, you should be careful not to close your eyes tightly. ° °2. Instill eye drops as instructed. To instill drops in your eye, open it, look up and have someone gently pull the lower lid down and instill a couple of drops inside the lower lid. ° °3. Do not touch upper lid. ° °4. Take Advil or Tylenol for pain. ° °5. You may use either eye for near work, such as reading or sewing and you may watch television. ° °6. You may have your hair done at the beauty parlor at any time. ° °7. Wear dark glasses with or without your own glasses if you are in bright light. ° °8. Call our office at [phone] or [phone] if you have sharp pain in your eye or unusual symptoms. ° °9.  FOLLOW UP WITH DR. SHAPIRO TODAY IN HIS [place] OFFICE AT 2:45pm. ° °  °I have received a copy of the above instructions and will follow them.  ° ° ° °IF YOU ARE IN IMMEDIATE DANGER CALL 911! ° °It is important for you to keep your follow-up appointment with your physician after discharge, OR, for you /your caregiver to make a follow-up appointment with your physician / medical provider after discharge. ° °Show these instructions to the next healthcare provider you see. ° °

## 2015-12-12 ENCOUNTER — Encounter (HOSPITAL_COMMUNITY): Payer: Self-pay | Admitting: Ophthalmology

## 2016-01-15 DIAGNOSIS — Z961 Presence of intraocular lens: Secondary | ICD-10-CM | POA: Diagnosis not present

## 2016-05-13 DIAGNOSIS — Z961 Presence of intraocular lens: Secondary | ICD-10-CM | POA: Diagnosis not present

## 2016-07-14 ENCOUNTER — Ambulatory Visit (INDEPENDENT_AMBULATORY_CARE_PROVIDER_SITE_OTHER): Payer: Medicare Other | Admitting: *Deleted

## 2016-07-14 DIAGNOSIS — Z23 Encounter for immunization: Secondary | ICD-10-CM

## 2016-08-04 ENCOUNTER — Other Ambulatory Visit: Payer: Self-pay | Admitting: Family Medicine

## 2016-08-04 DIAGNOSIS — Z1231 Encounter for screening mammogram for malignant neoplasm of breast: Secondary | ICD-10-CM

## 2016-08-13 ENCOUNTER — Telehealth: Payer: Self-pay | Admitting: Family Medicine

## 2016-08-13 DIAGNOSIS — Z0000 Encounter for general adult medical examination without abnormal findings: Secondary | ICD-10-CM

## 2016-08-13 DIAGNOSIS — Z79899 Other long term (current) drug therapy: Secondary | ICD-10-CM

## 2016-08-13 DIAGNOSIS — E785 Hyperlipidemia, unspecified: Secondary | ICD-10-CM

## 2016-08-13 NOTE — Telephone Encounter (Signed)
Lip liv m7 cbc 

## 2016-08-13 NOTE — Telephone Encounter (Signed)
Orders put in. Pt notified.  

## 2016-08-13 NOTE — Telephone Encounter (Signed)
Patient has physical 09/16/16 and needing labs done.

## 2016-08-14 DIAGNOSIS — E785 Hyperlipidemia, unspecified: Secondary | ICD-10-CM | POA: Diagnosis not present

## 2016-08-14 DIAGNOSIS — Z0000 Encounter for general adult medical examination without abnormal findings: Secondary | ICD-10-CM | POA: Diagnosis not present

## 2016-08-14 DIAGNOSIS — Z79899 Other long term (current) drug therapy: Secondary | ICD-10-CM | POA: Diagnosis not present

## 2016-08-15 LAB — LIPID PANEL
Chol/HDL Ratio: 3.4 ratio units (ref 0.0–4.4)
Cholesterol, Total: 205 mg/dL — ABNORMAL HIGH (ref 100–199)
HDL: 60 mg/dL (ref >39)
LDL CALC: 118 mg/dL — AB (ref 0–99)
Triglycerides: 135 mg/dL (ref 0–149)
VLDL CHOLESTEROL CAL: 27 mg/dL (ref 5–40)

## 2016-08-15 LAB — CBC WITH DIFFERENTIAL/PLATELET
BASOS: 0 %
Basophils Absolute: 0 10*3/uL (ref 0.0–0.2)
EOS (ABSOLUTE): 0.1 10*3/uL (ref 0.0–0.4)
EOS: 2 %
HEMATOCRIT: 41.4 % (ref 34.0–46.6)
Hemoglobin: 13.8 g/dL (ref 11.1–15.9)
IMMATURE GRANS (ABS): 0 10*3/uL (ref 0.0–0.1)
IMMATURE GRANULOCYTES: 0 %
LYMPHS: 42 %
Lymphocytes Absolute: 2.3 10*3/uL (ref 0.7–3.1)
MCH: 30.5 pg (ref 26.6–33.0)
MCHC: 33.3 g/dL (ref 31.5–35.7)
MCV: 92 fL (ref 79–97)
MONOCYTES: 9 %
Monocytes Absolute: 0.5 10*3/uL (ref 0.1–0.9)
NEUTROS PCT: 47 %
Neutrophils Absolute: 2.5 10*3/uL (ref 1.4–7.0)
PLATELETS: 166 10*3/uL (ref 150–379)
RBC: 4.52 x10E6/uL (ref 3.77–5.28)
RDW: 14.3 % (ref 12.3–15.4)
WBC: 5.4 10*3/uL (ref 3.4–10.8)

## 2016-08-15 LAB — HEPATIC FUNCTION PANEL
ALT: 15 IU/L (ref 0–32)
AST: 19 IU/L (ref 0–40)
Albumin: 4.2 g/dL (ref 3.5–4.8)
Alkaline Phosphatase: 66 IU/L (ref 39–117)
Bilirubin Total: 0.6 mg/dL (ref 0.0–1.2)
Bilirubin, Direct: 0.14 mg/dL (ref 0.00–0.40)
TOTAL PROTEIN: 6.4 g/dL (ref 6.0–8.5)

## 2016-08-15 LAB — BASIC METABOLIC PANEL
BUN/Creatinine Ratio: 14 (ref 12–28)
BUN: 11 mg/dL (ref 8–27)
CALCIUM: 9.1 mg/dL (ref 8.7–10.3)
CO2: 26 mmol/L (ref 18–29)
CREATININE: 0.76 mg/dL (ref 0.57–1.00)
Chloride: 105 mmol/L (ref 96–106)
GFR calc Af Amer: 88 mL/min/{1.73_m2} (ref >59)
GFR, EST NON AFRICAN AMERICAN: 76 mL/min/{1.73_m2} (ref >59)
Glucose: 90 mg/dL (ref 65–99)
Potassium: 4.4 mmol/L (ref 3.5–5.2)
Sodium: 146 mmol/L — ABNORMAL HIGH (ref 134–144)

## 2016-09-04 ENCOUNTER — Ambulatory Visit (HOSPITAL_COMMUNITY)
Admission: RE | Admit: 2016-09-04 | Discharge: 2016-09-04 | Disposition: A | Discharge status: Home / Self Care | Payer: Medicare Other | Source: Ambulatory Visit | Attending: Family Medicine | Admitting: Family Medicine

## 2016-09-04 DIAGNOSIS — Z1231 Encounter for screening mammogram for malignant neoplasm of breast: Secondary | ICD-10-CM | POA: Diagnosis not present

## 2016-09-16 ENCOUNTER — Encounter: Payer: Self-pay | Admitting: Family Medicine

## 2016-09-16 ENCOUNTER — Ambulatory Visit (INDEPENDENT_AMBULATORY_CARE_PROVIDER_SITE_OTHER): Payer: Medicare Other | Admitting: Family Medicine

## 2016-09-16 VITALS — BP 132/86 | Ht 68.0 in | Wt 138.8 lb

## 2016-09-16 DIAGNOSIS — Z0000 Encounter for general adult medical examination without abnormal findings: Secondary | ICD-10-CM

## 2016-09-16 NOTE — Patient Instructions (Signed)
Results for orders placed or performed in visit on 08/13/16  Lipid panel  Result Value Ref Range   Cholesterol, Total 205 (H) 100 - 199 mg/dL   Triglycerides 135 0 - 149 mg/dL   HDL 60 >39 mg/dL   VLDL Cholesterol Cal 27 5 - 40 mg/dL   LDL Calculated 118 (H) 0 - 99 mg/dL   Chol/HDL Ratio 3.4 0.0 - 4.4 ratio units  Hepatic function panel  Result Value Ref Range   Total Protein 6.4 6.0 - 8.5 g/dL   Albumin 4.2 3.5 - 4.8 g/dL   Bilirubin Total 0.6 0.0 - 1.2 mg/dL   Bilirubin, Direct 0.14 0.00 - 0.40 mg/dL   Alkaline Phosphatase 66 39 - 117 IU/L   AST 19 0 - 40 IU/L   ALT 15 0 - 32 IU/L  Basic metabolic panel  Result Value Ref Range   Glucose 90 65 - 99 mg/dL   BUN 11 8 - 27 mg/dL   Creatinine, Ser 0.76 0.57 - 1.00 mg/dL   GFR calc non Af Amer 76 >59 mL/min/1.73   GFR calc Af Amer 88 >59 mL/min/1.73   BUN/Creatinine Ratio 14 12 - 28   Sodium 146 (H) 134 - 144 mmol/L   Potassium 4.4 3.5 - 5.2 mmol/L   Chloride 105 96 - 106 mmol/L   CO2 26 18 - 29 mmol/L   Calcium 9.1 8.7 - 10.3 mg/dL  CBC with Differential/Platelet  Result Value Ref Range   WBC 5.4 3.4 - 10.8 x10E3/uL   RBC 4.52 3.77 - 5.28 x10E6/uL   Hemoglobin 13.8 11.1 - 15.9 g/dL   Hematocrit 41.4 34.0 - 46.6 %   MCV 92 79 - 97 fL   MCH 30.5 26.6 - 33.0 pg   MCHC 33.3 31.5 - 35.7 g/dL   RDW 14.3 12.3 - 15.4 %   Platelets 166 150 - 379 x10E3/uL   Neutrophils 47 Not Estab. %   Lymphs 42 Not Estab. %   Monocytes 9 Not Estab. %   Eos 2 Not Estab. %   Basos 0 Not Estab. %   Neutrophils Absolute 2.5 1.4 - 7.0 x10E3/uL   Lymphocytes Absolute 2.3 0.7 - 3.1 x10E3/uL   Monocytes Absolute 0.5 0.1 - 0.9 x10E3/uL   EOS (ABSOLUTE) 0.1 0.0 - 0.4 x10E3/uL   Basophils Absolute 0.0 0.0 - 0.2 x10E3/uL   Immature Granulocytes 0 Not Estab. %   Immature Grans (Abs) 0.0 0.0 - 0.1 x10E3/uL

## 2016-09-16 NOTE — Progress Notes (Signed)
Subjective:    Patient ID: Debra George, female    DOB: February 22, 1941, 76 y.o.   MRN: [id number]  HPI AWV- Annual Wellness Visit  The patient was seen for their annual wellness visit. The patient's past medical history, surgical history, and family history were reviewed. Pertinent vaccines were reviewed ( tetanus, pneumonia, shingles, flu) The patient's medication list was reviewed and updated.  The height and weight were entered. The patient's current BMI is:21.1  Cognitive screening was completed. Outcome of Mini - Cog: pass  Falls within the past 6 months:none  Current tobacco usage: none (All patients who use tobacco were given written and verbal information on quitting)  Recent listing of emergency department/hospitalizations over the past year were reviewed.  current specialist the patient sees on a regular basis: none   Medicare annual wellness visit patient questionnaire was reviewed.  A written screening schedule for the patient for the next 5-10 years was given. Appropriate discussion of followup regarding next visit was discussed.  Walking six d per wk  Results for orders placed or performed in visit on 08/13/16  Lipid panel  Result Value Ref Range   Cholesterol, Total 205 (H) 100 - 199 mg/dL   Triglycerides 135 0 - 149 mg/dL   HDL 60 >39 mg/dL   VLDL Cholesterol Cal 27 5 - 40 mg/dL   LDL Calculated 118 (H) 0 - 99 mg/dL   Chol/HDL Ratio 3.4 0.0 - 4.4 ratio units  Hepatic function panel  Result Value Ref Range   Total Protein 6.4 6.0 - 8.5 g/dL   Albumin 4.2 3.5 - 4.8 g/dL   Bilirubin Total 0.6 0.0 - 1.2 mg/dL   Bilirubin, Direct 0.14 0.00 - 0.40 mg/dL   Alkaline Phosphatase 66 39 - 117 IU/L   AST 19 0 - 40 IU/L   ALT 15 0 - 32 IU/L  Basic metabolic panel  Result Value Ref Range   Glucose 90 65 - 99 mg/dL   BUN 11 8 - 27 mg/dL   Creatinine, Ser 0.76 0.57 - 1.00 mg/dL   GFR calc non Af Amer 76 >59 mL/min/1.73   GFR calc Af Amer 88 >59 mL/min/1.73    BUN/Creatinine Ratio 14 12 - 28   Sodium 146 (H) 134 - 144 mmol/L   Potassium 4.4 3.5 - 5.2 mmol/L   Chloride 105 96 - 106 mmol/L   CO2 26 18 - 29 mmol/L   Calcium 9.1 8.7 - 10.3 mg/dL  CBC with Differential/Platelet  Result Value Ref Range   WBC 5.4 3.4 - 10.8 x10E3/uL   RBC 4.52 3.77 - 5.28 x10E6/uL   Hemoglobin 13.8 11.1 - 15.9 g/dL   Hematocrit 41.4 34.0 - 46.6 %   MCV 92 79 - 97 fL   MCH 30.5 26.6 - 33.0 pg   MCHC 33.3 31.5 - 35.7 g/dL   RDW 14.3 12.3 - 15.4 %   Platelets 166 150 - 379 x10E3/uL   Neutrophils 47 Not Estab. %   Lymphs 42 Not Estab. %   Monocytes 9 Not Estab. %   Eos 2 Not Estab. %   Basos 0 Not Estab. %   Neutrophils Absolute 2.5 1.4 - 7.0 x10E3/uL   Lymphocytes Absolute 2.3 0.7 - 3.1 x10E3/uL   Monocytes Absolute 0.5 0.1 - 0.9 x10E3/uL   EOS (ABSOLUTE) 0.1 0.0 - 0.4 x10E3/uL   Basophils Absolute 0.0 0.0 - 0.2 x10E3/uL   Immature Granulocytes 0 Not Estab. %   Immature Grans (Abs) 0.0 0.0 -  0.1 x10E3/uL   Diet overall pretty good, eating well, hx of watching things closely  utd on flu shot  lat colon in jan  Next one due in five yrs  Review of Systems  Constitutional: Negative for activity change, appetite change and fatigue.  HENT: Negative for congestion, ear discharge and rhinorrhea.   Eyes: Negative for discharge.  Respiratory: Negative for cough, chest tightness and wheezing.   Cardiovascular: Negative for chest pain.  Gastrointestinal: Negative for abdominal pain and vomiting.  Genitourinary: Negative for difficulty urinating and frequency.  Musculoskeletal: Negative for neck pain.  Allergic/Immunologic: Negative for environmental allergies and food allergies.  Neurological: Negative for weakness and headaches.  Psychiatric/Behavioral: Negative for agitation and behavioral problems.  All other systems reviewed and are negative.      Objective:   Physical Exam  Constitutional: She is oriented to person, place, and time. She appears  well-developed and well-nourished.  HENT:  Head: Normocephalic.  Right Ear: External ear normal.  Left Ear: External ear normal.  Eyes: Pupils are equal, round, and reactive to light.  Neck: Normal range of motion. No thyromegaly present.  Cardiovascular: Normal rate, regular rhythm, normal heart sounds and intact distal pulses.   No murmur heard. Pulmonary/Chest: Effort normal and breath sounds normal. No respiratory distress. She has no wheezes.  Abdominal: Soft. Bowel sounds are normal. She exhibits no distension and no mass. There is no tenderness.  Musculoskeletal: Normal range of motion. She exhibits no edema or tenderness.  Lymphadenopathy:    She has no cervical adenopathy.  Neurological: She is alert and oriented to person, place, and time. She exhibits normal muscle tone.  Skin: Skin is warm and dry.  Psychiatric: She has a normal mood and affect. Her behavior is normal.  Vitals reviewed.         Assessment & Plan:  Impression well adult exam plan diet exercise discussed. Mammogram reviewed. Up to date on colonoscopy. Stress of serious illness with husband discussed at length. Blood work reviewed

## 2016-12-15 ENCOUNTER — Other Ambulatory Visit: Payer: Self-pay | Admitting: Dermatology

## 2016-12-15 DIAGNOSIS — D492 Neoplasm of unspecified behavior of bone, soft tissue, and skin: Secondary | ICD-10-CM | POA: Diagnosis not present

## 2016-12-15 DIAGNOSIS — D225 Melanocytic nevi of trunk: Secondary | ICD-10-CM | POA: Diagnosis not present

## 2016-12-15 DIAGNOSIS — L821 Other seborrheic keratosis: Secondary | ICD-10-CM | POA: Diagnosis not present

## 2016-12-15 DIAGNOSIS — D229 Melanocytic nevi, unspecified: Secondary | ICD-10-CM | POA: Diagnosis not present

## 2017-01-26 ENCOUNTER — Encounter: Payer: Self-pay | Admitting: Family Medicine

## 2017-01-26 ENCOUNTER — Ambulatory Visit (INDEPENDENT_AMBULATORY_CARE_PROVIDER_SITE_OTHER): Payer: Medicare Other | Admitting: Family Medicine

## 2017-01-26 VITALS — BP 132/80 | Temp 98.0°F | Ht 68.0 in | Wt 133.6 lb

## 2017-01-26 DIAGNOSIS — A084 Viral intestinal infection, unspecified: Secondary | ICD-10-CM

## 2017-01-26 MED ORDER — ONDANSETRON 4 MG PO TBDP: 4.0000 mg | ORAL_TABLET | Freq: Three times a day (TID) | ORAL | 0 refills | Status: DC | PRN

## 2017-01-26 NOTE — Progress Notes (Signed)
   Subjective:    Patient ID: [name], female    DOB: 1940-09-30, 76 y.o.   MRN: [id number]  Emesis   This is a new problem. The current episode started yesterday. Associated symptoms comments: Diarrhea, headache, fever. She has tried acetaminophen for the symptoms.   Felt bad  Sudden onset  Both vomiting an d diarrhea  Appetite ximinhed   Feeling somewhat better, still has dirrhea, feels nauseated  tmax 100.5   No resp symtoms   Review of Systems  Gastrointestinal: Positive for vomiting.  No headache, no major weight loss or weight gain, no chest pain no back pain abdominal pain no change in bowel habits complete ROS otherwise negative      Objective:   Physical Exam  Alert vitals stable, NAD. Blood pressure good on repeat. HEENT normal. Lungs clear. Heart regular rate and rhythm.       Assessment & Plan:  Impression viral gastritis plan symptom care discussed warning signs discussed. Diet discussed.

## 2017-04-22 ENCOUNTER — Other Ambulatory Visit: Payer: Self-pay | Admitting: Dermatology

## 2017-04-22 DIAGNOSIS — D492 Neoplasm of unspecified behavior of bone, soft tissue, and skin: Secondary | ICD-10-CM | POA: Diagnosis not present

## 2017-04-22 DIAGNOSIS — D229 Melanocytic nevi, unspecified: Secondary | ICD-10-CM | POA: Diagnosis not present

## 2017-04-22 DIAGNOSIS — L821 Other seborrheic keratosis: Secondary | ICD-10-CM | POA: Diagnosis not present

## 2017-05-19 DIAGNOSIS — H353131 Nonexudative age-related macular degeneration, bilateral, early dry stage: Secondary | ICD-10-CM | POA: Diagnosis not present

## 2017-05-19 DIAGNOSIS — Z961 Presence of intraocular lens: Secondary | ICD-10-CM | POA: Diagnosis not present

## 2017-05-19 DIAGNOSIS — H5213 Myopia, bilateral: Secondary | ICD-10-CM | POA: Diagnosis not present

## 2017-05-19 DIAGNOSIS — H524 Presbyopia: Secondary | ICD-10-CM | POA: Diagnosis not present

## 2017-06-30 ENCOUNTER — Ambulatory Visit (INDEPENDENT_AMBULATORY_CARE_PROVIDER_SITE_OTHER): Payer: Medicare Other

## 2017-06-30 DIAGNOSIS — Z23 Encounter for immunization: Secondary | ICD-10-CM | POA: Diagnosis not present

## 2017-08-10 ENCOUNTER — Other Ambulatory Visit: Payer: Self-pay | Admitting: Family Medicine

## 2017-08-10 DIAGNOSIS — Z1231 Encounter for screening mammogram for malignant neoplasm of breast: Secondary | ICD-10-CM

## 2017-09-09 ENCOUNTER — Ambulatory Visit (HOSPITAL_COMMUNITY)
Admission: RE | Admit: 2017-09-09 | Discharge: 2017-09-09 | Disposition: A | Discharge status: Home / Self Care | Payer: Medicare Other | Source: Ambulatory Visit | Attending: Family Medicine | Admitting: Family Medicine

## 2017-09-09 DIAGNOSIS — Z1231 Encounter for screening mammogram for malignant neoplasm of breast: Secondary | ICD-10-CM | POA: Diagnosis not present

## 2017-09-10 ENCOUNTER — Encounter: Payer: Self-pay | Admitting: Family Medicine

## 2017-09-10 ENCOUNTER — Ambulatory Visit: Payer: Medicare Other | Admitting: Family Medicine

## 2017-09-10 VITALS — BP 148/82 | Temp 98.9°F | Ht 68.0 in | Wt 193.0 lb

## 2017-09-10 DIAGNOSIS — J329 Chronic sinusitis, unspecified: Secondary | ICD-10-CM

## 2017-09-10 DIAGNOSIS — J029 Acute pharyngitis, unspecified: Secondary | ICD-10-CM | POA: Diagnosis not present

## 2017-09-10 LAB — POCT RAPID STREP A (OFFICE): Rapid Strep A Screen: NEGATIVE

## 2017-09-10 MED ORDER — AMOXICILLIN 500 MG PO CAPS: 500.0000 mg | ORAL_CAPSULE | Freq: Three times a day (TID) | ORAL | 0 refills | Status: DC

## 2017-09-10 NOTE — Progress Notes (Signed)
   Subjective:    Patient ID: Debra George, female    DOB: [birth year], 76 y.o.   MRN: [id number]  HPI  Patient is here today with complaints of sore throat,cough non productive.Runny nose started Sunday. Has taken Allergy otc med and tylenol.  Cough occasionally productive Headache off and on.  Positive nasal congestion.  Positive pressure. Review of Systems Results for orders placed or performed in visit on 09/10/17  POCT rapid strep A  Result Value Ref Range   Rapid Strep A Screen Negative Negative   Throat inflammed and scratch  Di enregy  No nosebleeds     Objective:   Physical Exam Alert, mild malaise. Hydration good Vitals stable. frontal/ maxillary tenderness evident positive nasal congestion. pharynx normal neck supple  lungs clear/no crackles or wheezes. heart regular in rhythm        Assessment & Plan:  Impression rhinosinusitis likely post viral, discussed with patient. plan antibiotics prescribed. Questions answered. Symptomatic care discussed. warning signs discussed. WSL

## 2017-09-11 LAB — STREP A DNA PROBE: STREP GP A DIRECT, DNA PROBE: NEGATIVE

## 2017-09-17 ENCOUNTER — Encounter: Payer: Self-pay | Admitting: Family Medicine

## 2017-09-17 ENCOUNTER — Ambulatory Visit (INDEPENDENT_AMBULATORY_CARE_PROVIDER_SITE_OTHER): Payer: Medicare Other | Admitting: Family Medicine

## 2017-09-17 VITALS — BP 134/80 | Ht 67.25 in | Wt 137.0 lb

## 2017-09-17 DIAGNOSIS — Z0000 Encounter for general adult medical examination without abnormal findings: Secondary | ICD-10-CM

## 2017-09-17 NOTE — Progress Notes (Signed)
   Subjective:    Patient ID: Debra George, female    DOB: July 02, 1941, 77 y.o.   MRN: [id number]  HPI AWV- Annual Wellness Visit  The patient was seen for their annual wellness visit. The patient's past medical history, surgical history, and family history were reviewed. Pertinent vaccines were reviewed ( tetanus, pneumonia, shingles, flu) The patient's medication list was reviewed and updated.  The height and weight were entered. The patient's current BMI is: 21.3  Cognitive screening was completed. Outcome of Mini - Cog: pass   Falls within the past 6 months: had a fall about one month ago. Fell over a rock and hit head and knee. Pt states ok now. No problems.   Current tobacco usage: none (All patients who use tobacco were given written and verbal information on quitting)  Recent listing of emergency department/hospitalizations over the past year were reviewed.  current specialist the patient sees on a regular basis: none   Medicare annual wellness visit patient questionnaire was reviewed.  A written screening schedule for the patient for the next 5-10 years was given. Appropriate discussion of followup regarding next visit was discussed.   Coughing at times, still uses spome honey  BP numbers are good  Colon utd last one did  Two yrs ago, told needs another in five yrs from then   bms better    Review of Systems  Constitutional: Negative for activity change, appetite change and fatigue.  HENT: Negative for congestion and rhinorrhea.   Eyes: Negative for discharge.  Respiratory: Negative for cough, chest tightness and wheezing.   Cardiovascular: Negative for chest pain.  Gastrointestinal: Negative for abdominal pain, blood in stool and vomiting.  Endocrine: Negative for polyphagia.  Genitourinary: Negative for difficulty urinating and frequency.  Musculoskeletal: Negative for neck pain.  Skin: Negative for color change.  Allergic/Immunologic: Negative for  environmental allergies and food allergies.  Neurological: Negative for weakness and headaches.  Psychiatric/Behavioral: Negative for agitation and behavioral problems.  All other systems reviewed and are negative.      Objective:   Physical Exam  Constitutional: She is oriented to person, place, and time. She appears well-developed and well-nourished.  HENT:  Head: Normocephalic and atraumatic.  Right Ear: External ear normal.  Left Ear: External ear normal.  Eyes: Right eye exhibits no discharge. Left eye exhibits no discharge.  Neck: Normal range of motion. No tracheal deviation present.  Cardiovascular: Normal rate, regular rhythm, normal heart sounds and intact distal pulses. Exam reveals no gallop.  No murmur heard. Pulmonary/Chest: Effort normal and breath sounds normal. No stridor. No respiratory distress. She has no wheezes. She has no rales.  Abdominal: Soft. Bowel sounds are normal. She exhibits no distension and no mass. There is no tenderness. There is no rebound and no guarding.  Genitourinary:  Genitourinary Comments: Pelvic and rectal exam performed within normal limits.  Breast exam no masses  Musculoskeletal: Normal range of motion. She exhibits no edema or tenderness.  Lymphadenopathy:    She has no cervical adenopathy.  Neurological: She is alert and oriented to person, place, and time. She exhibits normal muscle tone.  Skin: Skin is warm and dry.  Psychiatric: She has a normal mood and affect. Her behavior is normal.  Vitals reviewed.         Assessment & Plan:  Impression well adult exam.  Up-to-date on colonoscopy.  Up-to-date on mammograms.  Up-to-date on flu shot.  Declines shingles vaccine.  Diet exercise discussed/follow-up as scheduled

## 2017-09-29 ENCOUNTER — Ambulatory Visit: Payer: Medicare Other | Admitting: Family Medicine

## 2017-09-29 ENCOUNTER — Encounter: Payer: Self-pay | Admitting: Family Medicine

## 2017-09-29 VITALS — BP 138/82 | Temp 98.3°F | Ht 67.25 in | Wt 139.6 lb

## 2017-09-29 DIAGNOSIS — J329 Chronic sinusitis, unspecified: Secondary | ICD-10-CM

## 2017-09-29 MED ORDER — CEPHALEXIN 500 MG PO CAPS: 500.0000 mg | ORAL_CAPSULE | Freq: Three times a day (TID) | ORAL | 0 refills | Status: DC

## 2017-09-29 NOTE — Progress Notes (Signed)
   Subjective:    Patient ID: [name], female    DOB: Mar 28, 1941, 77 y.o.   MRN: [id number]  Cough  This is a new problem. The current episode started in the past 7 days. Associated symptoms include nasal congestion.   Was sick in December  Last tue or wed started feeling bad, by the weekend coughing, not feeling the best, there is a tick le in the throat  Was around some sickness the week before with sister n law   Low grade fever  99.8 temp  Did not run a fev yes  Uses robitussin dm  And honey prn      Review of Systems  Respiratory: Positive for cough.        Objective:   Physical Exam  Alert, mild malaise. Hydration good Vitals stable. frontal/ maxillary tenderness evident positive nasal congestion. pharynx normal neck supple  lungs clear/no crackles or wheezes. heart regular in rhythm      Assessment & Plan:  Impression rhinosinusitis likely post viral, discussed with patient. plan antibiotics prescribed. Questions answered. Symptomatic care discussed. warning signs discussed. WSL

## 2017-11-02 ENCOUNTER — Ambulatory Visit: Payer: Medicare Other | Admitting: Family Medicine

## 2017-11-02 ENCOUNTER — Encounter: Payer: Self-pay | Admitting: Family Medicine

## 2017-11-02 VITALS — BP 150/88 | Ht 67.25 in | Wt 139.0 lb

## 2017-11-02 DIAGNOSIS — J329 Chronic sinusitis, unspecified: Secondary | ICD-10-CM | POA: Diagnosis not present

## 2017-11-02 DIAGNOSIS — J310 Chronic rhinitis: Secondary | ICD-10-CM

## 2017-11-02 MED ORDER — CEFPROZIL 500 MG PO TABS: 500.0000 mg | ORAL_TABLET | Freq: Two times a day (BID) | ORAL | 0 refills | Status: DC

## 2017-11-02 NOTE — Progress Notes (Signed)
   Subjective:    Patient ID: Debra George, female    DOB: Aug 20, 1941, 77 y.o.   MRN: [id number]  HPI Patient is here today with complaints of non productive dry hacking cough,headache,sinus drainage,runny nose,fever started on Saturday. Has been using Robitussin and otc allergy medication which have not helped much.  Low gr fever, off and on, on t  forntal headache achey and sharp, worse with cough, comes and goes    Prn robitussin and cong meds    Review of Systems No headache, no major weight loss or weight gain, no chest pain no back pain abdominal pain no change in bowel habits complete ROS otherwise negative     Objective:   Physical Exam  Alert, mild malaise. Hydration good Vitals stable. frontal/ maxillary tenderness evident positive nasal congestion. pharynx normal neck supple  lungs clear/no crackles or wheezes. heart regular in rhythm       Assessment & Plan:  Impression rhinosinusitis likely post viral, discussed with patient. plan antibiotics prescribed. Questions answered. Symptomatic care discussed. warning signs discussed. WSL

## 2018-01-22 ENCOUNTER — Telehealth: Payer: Self-pay | Admitting: Family Medicine

## 2018-01-22 NOTE — Telephone Encounter (Signed)
Pt husband called stating that wife fell a couple days ago. States that her right first knuckle is bruised and has a cut on it.they have been putting band aids on it but husbands states it looks infected. States it is sore and red. Pt advised we had no availability and advised to go to Urgent Care or ER.

## 2018-04-16 DIAGNOSIS — Z961 Presence of intraocular lens: Secondary | ICD-10-CM | POA: Diagnosis not present

## 2018-04-16 DIAGNOSIS — H04123 Dry eye syndrome of bilateral lacrimal glands: Secondary | ICD-10-CM | POA: Diagnosis not present

## 2018-04-16 DIAGNOSIS — H26491 Other secondary cataract, right eye: Secondary | ICD-10-CM | POA: Diagnosis not present

## 2018-04-16 DIAGNOSIS — H353131 Nonexudative age-related macular degeneration, bilateral, early dry stage: Secondary | ICD-10-CM | POA: Diagnosis not present

## 2018-04-21 DIAGNOSIS — H26492 Other secondary cataract, left eye: Secondary | ICD-10-CM | POA: Diagnosis not present

## 2018-07-01 ENCOUNTER — Ambulatory Visit (INDEPENDENT_AMBULATORY_CARE_PROVIDER_SITE_OTHER): Payer: Medicare Other

## 2018-07-01 DIAGNOSIS — Z23 Encounter for immunization: Secondary | ICD-10-CM

## 2018-08-03 ENCOUNTER — Other Ambulatory Visit: Payer: Self-pay | Admitting: Family Medicine

## 2018-08-03 DIAGNOSIS — Z1231 Encounter for screening mammogram for malignant neoplasm of breast: Secondary | ICD-10-CM

## 2018-09-02 ENCOUNTER — Encounter: Payer: Self-pay | Admitting: Internal Medicine

## 2018-09-20 ENCOUNTER — Ambulatory Visit (HOSPITAL_COMMUNITY)
Admission: RE | Admit: 2018-09-20 | Discharge: 2018-09-20 | Disposition: A | Discharge status: Home / Self Care | Payer: Medicare Other | Source: Ambulatory Visit | Attending: Family Medicine | Admitting: Family Medicine

## 2018-09-20 DIAGNOSIS — Z1231 Encounter for screening mammogram for malignant neoplasm of breast: Secondary | ICD-10-CM | POA: Diagnosis not present

## 2018-09-21 ENCOUNTER — Ambulatory Visit (INDEPENDENT_AMBULATORY_CARE_PROVIDER_SITE_OTHER): Payer: Medicare Other | Admitting: Family Medicine

## 2018-09-21 ENCOUNTER — Encounter: Payer: Self-pay | Admitting: Family Medicine

## 2018-09-21 VITALS — BP 136/90 | Ht 66.5 in | Wt 138.2 lb

## 2018-09-21 DIAGNOSIS — Z13220 Encounter for screening for lipoid disorders: Secondary | ICD-10-CM

## 2018-09-21 DIAGNOSIS — M8580 Other specified disorders of bone density and structure, unspecified site: Secondary | ICD-10-CM

## 2018-09-21 DIAGNOSIS — Z79899 Other long term (current) drug therapy: Secondary | ICD-10-CM

## 2018-09-21 DIAGNOSIS — Z0000 Encounter for general adult medical examination without abnormal findings: Secondary | ICD-10-CM

## 2018-09-21 DIAGNOSIS — Z23 Encounter for immunization: Secondary | ICD-10-CM | POA: Diagnosis not present

## 2018-09-21 NOTE — Progress Notes (Signed)
   Subjective:    Patient ID: Debra George, female    DOB: [birth year], 78 y.o.   MRN: [id number]  HPI AWV- Annual Wellness Visit  The patient was seen for their annual wellness visit. The patient's past medical history, surgical history, and family history were reviewed. Pertinent vaccines were reviewed ( tetanus, pneumonia, shingles, flu) The patient's medication list was reviewed and updated.  The height and weight were entered.  BMI recorded in electronic record elsewhere  Cognitive screening was completed. Outcome of Mini - Cog: pass   Falls /depression screening electronically recorded within record elsewhere  Current tobacco usage: (All patients who use tobacco were given written and verbal information on quitting)  Recent listing of emergency department/hospitalizations over the past year were reviewed.  current specialist the patient sees on a regular basis: none   Medicare annual wellness visit patient questionnaire was reviewed.  A written screening schedule for the patient for the next 5-10 years was given. Appropriate discussion of followup regarding next visit was discussed.      Review of Systems  Constitutional: Negative for activity change, appetite change and fatigue.  HENT: Negative for congestion and rhinorrhea.   Eyes: Negative for discharge.  Respiratory: Negative for cough, chest tightness and wheezing.   Cardiovascular: Negative for chest pain.  Gastrointestinal: Negative for abdominal pain, blood in stool and vomiting.  Endocrine: Negative for polyphagia.  Genitourinary: Negative for difficulty urinating and frequency.  Musculoskeletal: Negative for neck pain.  Skin: Negative for color change.  Allergic/Immunologic: Negative for environmental allergies and food allergies.  Neurological: Negative for weakness and headaches.  Psychiatric/Behavioral: Negative for agitation and behavioral problems.  All other systems reviewed and are  negative.      Objective:   Physical Exam Constitutional:      Appearance: She is well-developed.  HENT:     Head: Normocephalic.     Right Ear: External ear normal.     Left Ear: External ear normal.  Eyes:     Pupils: Pupils are equal, round, and reactive to light.  Neck:     Musculoskeletal: Normal range of motion.     Thyroid: No thyromegaly.  Cardiovascular:     Rate and Rhythm: Normal rate and regular rhythm.     Heart sounds: Normal heart sounds. No murmur.  Pulmonary:     Effort: Pulmonary effort is normal. No respiratory distress.     Breath sounds: Normal breath sounds. No wheezing.  Abdominal:     General: Bowel sounds are normal. There is no distension.     Palpations: Abdomen is soft. There is no mass.     Tenderness: There is no abdominal tenderness.  Musculoskeletal: Normal range of motion.        General: No tenderness.  Lymphadenopathy:     Cervical: No cervical adenopathy.  Skin:    General: Skin is warm and dry.  Neurological:     Mental Status: She is alert and oriented to person, place, and time.     Motor: No abnormal muscle tone.  Psychiatric:        Behavior: Behavior normal.           Assessment & Plan:  Impression wellness exam.  Diet discussed.  Exercise discussed.  Up-to-date on mammograms.  Appropriate blood work ordered.  Pneumococcal injection today.  Osteoporosis screening

## 2018-09-22 DIAGNOSIS — Z13220 Encounter for screening for lipoid disorders: Secondary | ICD-10-CM | POA: Diagnosis not present

## 2018-09-22 DIAGNOSIS — Z0000 Encounter for general adult medical examination without abnormal findings: Secondary | ICD-10-CM | POA: Diagnosis not present

## 2018-09-22 DIAGNOSIS — Z79899 Other long term (current) drug therapy: Secondary | ICD-10-CM | POA: Diagnosis not present

## 2018-09-23 ENCOUNTER — Encounter: Payer: Self-pay | Admitting: Family Medicine

## 2018-09-23 ENCOUNTER — Ambulatory Visit: Payer: Medicare Other | Admitting: Family Medicine

## 2018-09-23 VITALS — BP 130/84 | Temp 97.6°F | Wt 137.4 lb

## 2018-09-23 DIAGNOSIS — R21 Rash and other nonspecific skin eruption: Secondary | ICD-10-CM

## 2018-09-23 LAB — BASIC METABOLIC PANEL
BUN/Creatinine Ratio: 14 (ref 12–28)
BUN: 12 mg/dL (ref 8–27)
CO2: 25 mmol/L (ref 20–29)
Calcium: 9.5 mg/dL (ref 8.7–10.3)
Chloride: 101 mmol/L (ref 96–106)
Creatinine, Ser: 0.85 mg/dL (ref 0.57–1.00)
GFR, EST AFRICAN AMERICAN: 76 mL/min/{1.73_m2} (ref >59)
GFR, EST NON AFRICAN AMERICAN: 66 mL/min/{1.73_m2} (ref >59)
Glucose: 90 mg/dL (ref 65–99)
POTASSIUM: 4.3 mmol/L (ref 3.5–5.2)
SODIUM: 141 mmol/L (ref 134–144)

## 2018-09-23 LAB — CBC WITH DIFFERENTIAL/PLATELET
BASOS: 0 %
Basophils Absolute: 0 10*3/uL (ref 0.0–0.2)
EOS (ABSOLUTE): 0.1 10*3/uL (ref 0.0–0.4)
Eos: 1 %
Hematocrit: 40.4 % (ref 34.0–46.6)
Hemoglobin: 14.1 g/dL (ref 11.1–15.9)
Immature Grans (Abs): 0 10*3/uL (ref 0.0–0.1)
Immature Granulocytes: 0 %
Lymphocytes Absolute: 2.4 10*3/uL (ref 0.7–3.1)
Lymphs: 27 %
MCH: 31.8 pg (ref 26.6–33.0)
MCHC: 34.9 g/dL (ref 31.5–35.7)
MCV: 91 fL (ref 79–97)
MONOS ABS: 0.9 10*3/uL (ref 0.1–0.9)
Monocytes: 10 %
Neutrophils Absolute: 5.6 10*3/uL (ref 1.4–7.0)
Neutrophils: 62 %
PLATELETS: 163 10*3/uL (ref 150–450)
RBC: 4.44 x10E6/uL (ref 3.77–5.28)
RDW: 13.6 % (ref 11.7–15.4)
WBC: 9 10*3/uL (ref 3.4–10.8)

## 2018-09-23 LAB — HEPATIC FUNCTION PANEL
ALBUMIN: 4.5 g/dL (ref 3.5–4.8)
ALK PHOS: 75 IU/L (ref 39–117)
ALT: 16 IU/L (ref 0–32)
AST: 22 IU/L (ref 0–40)
BILIRUBIN TOTAL: 1 mg/dL (ref 0.0–1.2)
Bilirubin, Direct: 0.22 mg/dL (ref 0.00–0.40)
Total Protein: 6.4 g/dL (ref 6.0–8.5)

## 2018-09-23 LAB — LIPID PANEL
CHOLESTEROL TOTAL: 187 mg/dL (ref 100–199)
Chol/HDL Ratio: 3.1 ratio (ref 0.0–4.4)
HDL: 61 mg/dL (ref >39)
LDL Calculated: 107 mg/dL — ABNORMAL HIGH (ref 0–99)
TRIGLYCERIDES: 93 mg/dL (ref 0–149)
VLDL CHOLESTEROL CAL: 19 mg/dL (ref 5–40)

## 2018-09-23 MED ORDER — PREDNISONE 10 MG PO TABS: ORAL_TABLET | ORAL | 0 refills | Status: DC

## 2018-09-23 NOTE — Progress Notes (Signed)
   Subjective:    Patient ID: Debra George, female    DOB: Apr 08, 1941, 78 y.o.   MRN: [id number]  Fever   This is a new problem. The current episode started yesterday. The maximum temperature noted was 101 to 101.9 F. Associated symptoms comments: Pt has pneumonia shot on 09/21/2018 and has now developed a fever , redness, hot to touch and painful area on injection site.. She has tried acetaminophen for the symptoms. The treatment provided mild relief.   Patient has never had such a reaction to pneumonia shots in the past.  That the injection.  Within 1 day had developed a rash.  Sedated fever at times.  Tender to touch.  Feeling a little better after having taken Tylenol   Review of Systems  Constitutional: Positive for fever.       Objective:   Physical Exam Alert vitals stable, NAD. Blood pressure good on repeat. HEENT normal. Lungs clear. Heart regular rate and rhythm. Erythematous patch left lateral arm.  Some tender to palpation.  Positive warmth       Assessment & Plan:  Impression post injection fairly severe localized reaction.  Discussed.  Warning signs discussed.  Modified prednisone taper.  Greater than 50% of this 15 minute face to face visit was spent in counseling and discussion and coordination of care regarding the above diagnosis/diagnosies

## 2018-09-26 ENCOUNTER — Encounter: Payer: Self-pay | Admitting: Family Medicine

## 2018-09-30 ENCOUNTER — Ambulatory Visit (HOSPITAL_COMMUNITY)
Admission: RE | Admit: 2018-09-30 | Discharge: 2018-09-30 | Disposition: A | Discharge status: Home / Self Care | Payer: Medicare Other | Source: Ambulatory Visit | Attending: Family Medicine | Admitting: Family Medicine

## 2018-09-30 DIAGNOSIS — Z780 Asymptomatic menopausal state: Secondary | ICD-10-CM | POA: Diagnosis not present

## 2018-09-30 DIAGNOSIS — M85852 Other specified disorders of bone density and structure, left thigh: Secondary | ICD-10-CM | POA: Diagnosis not present

## 2018-09-30 DIAGNOSIS — M8580 Other specified disorders of bone density and structure, unspecified site: Secondary | ICD-10-CM | POA: Diagnosis not present

## 2018-10-12 ENCOUNTER — Ambulatory Visit (INDEPENDENT_AMBULATORY_CARE_PROVIDER_SITE_OTHER): Payer: Self-pay

## 2018-10-12 DIAGNOSIS — Z86010 Personal history of colonic polyps: Secondary | ICD-10-CM

## 2018-10-12 MED ORDER — PEG 3350-KCL-NA BICARB-NACL 420 G PO SOLR: 4000.0000 mL | ORAL | 0 refills | Status: DC

## 2018-10-12 NOTE — Patient Instructions (Addendum)
Debra George   1941-06-27 MRN: [id number]    Procedure Date: 06/15/2019 Time to register: 6:30am Place to register: [facility] Procedure Time: 7:30am Scheduled provider: R. Garfield Cornea, MD  PREPARATION FOR COLONOSCOPY WITH TRI-LYTE SPLIT PREP  Please notify us immediately if you are diabetic, take iron supplements, or if you are on Coumadin or any other blood thinners.   You will need to purchase 1 fleet enema and 1 box of Bisacodyl 75m tablets.   2 DAYS BEFORE PROCEDURE:  DATE: 06/13/2019   DAY: Monday  Begin clear liquid diet AFTER your lunch meal. NO SOLID FOODS after this point.  1 DAY BEFORE PROCEDURE:  DATE: 06/14/2019   DAY: Tuesday Continue clear liquids the entire day - NO SOLID FOOD.   At 2:00 pm:  Take 2 Bisacodyl tablets.   At 4:00pm:  Start drinking your solution. Make sure you mix well per instructions on the bottle. Try to drink 1 (one) 8 ounce glass every 10-15 minutes until you have consumed HALF the jug. You should complete by 6:00pm.You must keep the left over solution refrigerated until completed next day.  Continue clear liquids. You must drink plenty of clear liquids to prevent dehyration and kidney failure.     DAY OF PROCEDURE:   DATE: 06/15/2019   DAY: Wednesday If you take medications for your heart, blood pressure or breathing, you may take these medications.   Five hours before your procedure time @ 2:30am:  Finish remaining amout of bowel prep, drinking 1 (one) 8 ounce glass every 10-15 minutes until complete. You have two hours to consume remaining prep.   Three hours before your procedure time @4 :30am:  Nothing by mouth.   At least one hour before going to the hospital:  Give yourself one Fleet enema.   You may take your morning medications with sip of water unless we have instructed otherwise.      Please see below for Dietary Information.  CLEAR LIQUIDS INCLUDE:  Water Jello (NOT red in color)   Ice Popsicles (NOT red  in color)   Tea (sugar ok, no milk/cream) Powdered fruit flavored drinks  Coffee (sugar ok, no milk/cream) Gatorade/ Lemonade/ Kool-Aid  (NOT red in color)   Juice: apple, white grape, white cranberry Soft drinks  Clear bullion, consomme, broth (fat free beef/chicken/vegetable)  Carbonated beverages (any kind)  Strained chicken noodle soup Hard Candy   Remember: Clear liquids are liquids that will allow you to see your fingers on the other side of a clear glass. Be sure liquids are NOT red in color, and not cloudy, but CLEAR.  DO NOT EAT OR DRINK ANY OF THE FOLLOWING:  Dairy products of any kind   Cranberry juice Tomato juice / V8 juice   Grapefruit juice Orange juice     Red grape juice  Do not eat any solid foods, including such foods as: cereal, oatmeal, yogurt, fruits, vegetables, creamed soups, eggs, bread, crackers, pureed foods in a blender, etc.   HELPFUL HINTS FOR DRINKING PREP SOLUTION:   Make sure prep is extremely cold. Mix and refrigerate the the morning of the prep. You may also put in the freezer.   You may try mixing some Crystal Light or Country Time Lemonade if you prefer. Mix in small amounts; add more if necessary.  Try drinking through a straw  Rinse mouth with water or a mouthwash between glasses, to remove after-taste.  Try sipping on a cold beverage /ice/ popsicles between glasses of  prep.  Place a piece of sugar-free hard candy in mouth between glasses.  If you become nauseated, try consuming smaller amounts, or stretch out the time between glasses. Stop for 30-60 minutes, then slowly start back drinking.        OTHER INSTRUCTIONS  You will need a responsible adult at least 79 years of age to accompany you and drive you home. This person must remain in the waiting room during your procedure. The hospital will cancel your procedure if you do not have a responsible adult with you.   1. Wear loose fitting clothing that is easily removed. 2. Leave  jewelry and other valuables at home.  3. Remove all body piercing jewelry and leave at home. 4. Total time from sign-in until discharge is approximately 2-3 hours. 5. You should go home directly after your procedure and rest. You can resume normal activities the day after your procedure. 6. The day of your procedure you should not:  Drive  Make legal decisions  Operate machinery  Drink alcohol  Return to work   You may call the office (Dept: [phone]) before 5:00pm, or page the doctor on call [phone]) after 5:00pm, for further instructions, if necessary.   Insurance Information YOU WILL NEED TO CHECK WITH YOUR INSURANCE COMPANY FOR THE BENEFITS OF COVERAGE YOU HAVE FOR THIS PROCEDURE.  UNFORTUNATELY, NOT ALL INSURANCE COMPANIES HAVE BENEFITS TO COVER ALL OR PART OF THESE TYPES OF PROCEDURES.  IT IS YOUR RESPONSIBILITY TO CHECK YOUR BENEFITS, HOWEVER, WE WILL BE GLAD TO ASSIST YOU WITH ANY CODES YOUR INSURANCE COMPANY MAY NEED.    PLEASE NOTE THAT MOST INSURANCE COMPANIES WILL NOT COVER A SCREENING COLONOSCOPY FOR PEOPLE UNDER THE AGE OF 50  IF YOU HAVE [employer] INSURANCE, YOU MAY HAVE BENEFITS FOR A SCREENING COLONOSCOPY BUT IF POLYPS ARE FOUND THE DIAGNOSIS WILL CHANGE AND THEN YOU MAY HAVE A DEDUCTIBLE THAT WILL NEED TO BE MET. SO PLEASE MAKE SURE YOU CHECK YOUR BENEFITS FOR A SCREENING COLONOSCOPY AS WELL AS A DIAGNOSTIC COLONOSCOPY.

## 2018-10-12 NOTE — Progress Notes (Signed)
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 Advised her that since she was not having any problems at this time that the decision to proceed with a screening tcs at her age was up to her. Pt verbally stated she understood and would like to have one more tcs at this time.   1. Diabetes Melitis: no 2. Joint replacements in the past 12 months: no 3. Major health problems in the past 3 months: no 4. Has an artificial valve or MVP: no 5. Has a defibrillator: no 6. Has been advised in past to take antibiotics in advance of a procedure like teeth cleaning: no 7. Family history of colon cancer: no  8. Alcohol Use: no 9. History of sleep apnea: no  10. History of coronary artery or other vascular stents placed within the last 12 months: no 11. History of any prior anesthesia complications: no    MEDICATIONS & ALLERGIES:    Patient reports the following regarding taking any blood thinners:   Plavix? no Aspirin? no Coumadin? no Brilinta? no Xarelto? no Eliquis? no Pradaxa? no Savaysa? no Effient? no  Patient confirms/reports the following medications:  Current Outpatient Medications  Medication Sig Dispense Refill  . calcium-vitamin D (OSCAL WITH D) 500-200 MG-UNIT per tablet Take 1 tablet by mouth 2 (two) times daily.      . Multiple Vitamins-Minerals (PRESERVISION/LUTEIN) CAPS Take 1 capsule by mouth 2 (two) times daily.       No current facility-administered medications for this visit.     Patient confirms/reports the following allergies:  Allergies  Allergen Reactions  . Pneumococcal Polysaccharide Vaccine Swelling    No orders of the defined types were placed in this  encounter.   AUTHORIZATION INFORMATION Primary Insurance: [place],  ID #: [id number] Pre-Cert / Josem Kaufmann required: no   SCHEDULE INFORMATION: Procedure has been scheduled as follows:  Date: 12/28/18, Time:  8:45 Location: [facility]  This Gastroenterology Pre-Precedure Review Form is being routed to the following provider(s): Neil Crouch, PA

## 2018-10-20 NOTE — Progress Notes (Signed)
Ok to schedule.

## 2018-12-16 NOTE — Progress Notes (Signed)
Pt's procedure was re-scheduled due to COVID 19 and CDC guidelines.  Pt aware that we are mailing out new instructions.  Endo notified.

## 2019-02-14 NOTE — Progress Notes (Signed)
Pt re-scheduled her procedure to 06/15/2019 due to COVID 19.  Pt aware that we will mail her out new prep instructions.  Endo notified.

## 2019-03-16 ENCOUNTER — Telehealth: Payer: Self-pay | Admitting: *Deleted

## 2019-03-16 NOTE — Telephone Encounter (Signed)
Pt called back and is scheduled for her COVID 19 screening on 06/10/2019.  Pt is aware to remain in quarantine once testing is done.  Pt voiced understanding.

## 2019-03-16 NOTE — Telephone Encounter (Signed)
Left message with pt's husband for pt to call us back.  Pt will need to be scheduled for her COVID 19 screening.

## 2019-05-05 DIAGNOSIS — H5213 Myopia, bilateral: Secondary | ICD-10-CM | POA: Diagnosis not present

## 2019-05-05 DIAGNOSIS — Z961 Presence of intraocular lens: Secondary | ICD-10-CM | POA: Diagnosis not present

## 2019-05-05 DIAGNOSIS — H353131 Nonexudative age-related macular degeneration, bilateral, early dry stage: Secondary | ICD-10-CM | POA: Diagnosis not present

## 2019-05-05 DIAGNOSIS — H52203 Unspecified astigmatism, bilateral: Secondary | ICD-10-CM | POA: Diagnosis not present

## 2019-06-10 ENCOUNTER — Other Ambulatory Visit (HOSPITAL_COMMUNITY)
Admission: RE | Admit: 2019-06-10 | Discharge: 2019-06-10 | Disposition: A | Discharge status: Home / Self Care | Payer: Medicare Other | Source: Ambulatory Visit | Attending: Internal Medicine | Admitting: Internal Medicine

## 2019-06-10 DIAGNOSIS — Z01812 Encounter for preprocedural laboratory examination: Secondary | ICD-10-CM | POA: Insufficient documentation

## 2019-06-10 DIAGNOSIS — Z20828 Contact with and (suspected) exposure to other viral communicable diseases: Secondary | ICD-10-CM | POA: Insufficient documentation

## 2019-06-10 LAB — SARS CORONAVIRUS 2 (TAT 6-24 HRS): SARS Coronavirus 2: NEGATIVE

## 2019-06-14 ENCOUNTER — Telehealth: Payer: Self-pay | Admitting: Family Medicine

## 2019-06-14 NOTE — Telephone Encounter (Signed)
Patient wanted to know if she needs the senior flu shot or regular flu since she is over the age of 45. Please advise has appointment 10/3

## 2019-06-14 NOTE — Telephone Encounter (Signed)
Discussed with pt

## 2019-06-14 NOTE — Telephone Encounter (Signed)
Go ahead and get senior like her husb is getting

## 2019-06-15 ENCOUNTER — Ambulatory Visit (HOSPITAL_COMMUNITY)
Admission: RE | Admit: 2019-06-15 | Discharge: 2019-06-15 | Disposition: A | Discharge status: Home / Self Care | Payer: Medicare Other | Attending: Internal Medicine | Admitting: Internal Medicine

## 2019-06-15 ENCOUNTER — Encounter (HOSPITAL_COMMUNITY)
Admission: RE | Disposition: A | Discharge status: Home / Self Care | Payer: Self-pay | Source: Home / Self Care | Attending: Internal Medicine

## 2019-06-15 ENCOUNTER — Encounter (HOSPITAL_COMMUNITY): Payer: Self-pay | Admitting: *Deleted

## 2019-06-15 DIAGNOSIS — Z86010 Personal history of colonic polyps: Secondary | ICD-10-CM | POA: Insufficient documentation

## 2019-06-15 DIAGNOSIS — D123 Benign neoplasm of transverse colon: Secondary | ICD-10-CM | POA: Diagnosis not present

## 2019-06-15 DIAGNOSIS — K635 Polyp of colon: Secondary | ICD-10-CM

## 2019-06-15 DIAGNOSIS — Z1211 Encounter for screening for malignant neoplasm of colon: Secondary | ICD-10-CM | POA: Diagnosis not present

## 2019-06-15 DIAGNOSIS — Z87891 Personal history of nicotine dependence: Secondary | ICD-10-CM | POA: Insufficient documentation

## 2019-06-15 HISTORY — PX: POLYPECTOMY: SHX5525

## 2019-06-15 HISTORY — PX: COLONOSCOPY: SHX5424

## 2019-06-15 SURGERY — COLONOSCOPY
Anesthesia: Moderate Sedation

## 2019-06-15 MED ORDER — SODIUM CHLORIDE 0.9 % IV SOLN
INTRAVENOUS | Status: DC
  Administered 2019-06-15: 07:00:00 via INTRAVENOUS

## 2019-06-15 MED ORDER — STERILE WATER FOR IRRIGATION IR SOLN
Status: DC | PRN
  Administered 2019-06-15: 2.5 mL

## 2019-06-15 MED ORDER — MEPERIDINE HCL 100 MG/ML IJ SOLN
INTRAMUSCULAR | Status: DC | PRN
  Administered 2019-06-15: 15 mg via INTRAVENOUS
  Administered 2019-06-15: 25 mg via INTRAVENOUS

## 2019-06-15 MED ORDER — MEPERIDINE HCL 50 MG/ML IJ SOLN
INTRAMUSCULAR | Status: AC
  Filled 2019-06-15: qty 1

## 2019-06-15 MED ORDER — ONDANSETRON HCL 4 MG/2ML IJ SOLN
INTRAMUSCULAR | Status: AC
  Filled 2019-06-15: qty 2

## 2019-06-15 MED ORDER — MIDAZOLAM HCL 5 MG/5ML IJ SOLN
INTRAMUSCULAR | Status: AC
  Filled 2019-06-15: qty 10

## 2019-06-15 MED ORDER — MIDAZOLAM HCL 5 MG/5ML IJ SOLN
INTRAMUSCULAR | Status: DC | PRN
  Administered 2019-06-15 (×2): 1 mg via INTRAVENOUS
  Administered 2019-06-15: 2 mg via INTRAVENOUS
  Administered 2019-06-15: 1 mg via INTRAVENOUS

## 2019-06-15 NOTE — Discharge Instructions (Signed)
Colonoscopy Discharge Instructions  Read the instructions outlined below and refer to this sheet in the next few weeks. These discharge instructions provide you with general information on caring for yourself after you leave the hospital. Your doctor may also give you specific instructions. While your treatment has been planned according to the most current medical practices available, unavoidable complications occasionally occur. If you have any problems or questions after discharge, call Dr. Gala Romney at [phone]. ACTIVITY  You may resume your regular activity, but move at a slower pace for the next 24 hours.   Take frequent rest periods for the next 24 hours.   Walking will help get rid of the air and reduce the bloated feeling in your belly (abdomen).   No driving for 24 hours (because of the medicine (anesthesia) used during the test).    Do not sign any important legal documents or operate any machinery for 24 hours (because of the anesthesia used during the test).  NUTRITION  Drink plenty of fluids.   You may resume your normal diet as instructed by your doctor.   Begin with a light meal and progress to your normal diet. Heavy or fried foods are harder to digest and may make you feel sick to your stomach (nauseated).   Avoid alcoholic beverages for 24 hours or as instructed.  MEDICATIONS  You may resume your normal medications unless your doctor tells you otherwise.  WHAT YOU CAN EXPECT TODAY  Some feelings of bloating in the abdomen.   Passage of more gas than usual.   Spotting of blood in your stool or on the toilet paper.  IF YOU HAD POLYPS REMOVED DURING THE COLONOSCOPY:  No aspirin products for 7 days or as instructed.   No alcohol for 7 days or as instructed.   Eat a soft diet for the next 24 hours.  FINDING OUT THE RESULTS OF YOUR TEST Not all test results are available during your visit. If your test results are not back during the visit, make an appointment  with your caregiver to find out the results. Do not assume everything is normal if you have not heard from your caregiver or the medical facility. It is important for you to follow up on all of your test results.  SEEK IMMEDIATE MEDICAL ATTENTION IF:  You have more than a spotting of blood in your stool.   Your belly is swollen (abdominal distention).   You are nauseated or vomiting.   You have a temperature over 101.   You have abdominal pain or discomfort that is severe or gets worse throughout the day.    Colon polyp information provided  Further recommendations to follow pending review of pathology report  I called Claudette Stapler at [phone].  Got no answer.   Colon Polyps  Polyps are tissue growths inside the body. Polyps can grow in many places, including the large intestine (colon). A polyp may be a round bump or a mushroom-shaped growth. You could have one polyp or several. Most colon polyps are noncancerous (benign). However, some colon polyps can become cancerous over time. Finding and removing the polyps early can help prevent this. What are the causes? The exact cause of colon polyps is not known. What increases the risk? You are more likely to develop this condition if you:  Have a family history of colon cancer or colon polyps.  Are older than 20 or older than 45 if you are African American.  Have inflammatory bowel disease, such as  ulcerative colitis or Crohn's disease.  Have certain hereditary conditions, such as: ? Familial adenomatous polyposis. ? Lynch syndrome. ? Turcot syndrome. ? Peutz-Jeghers syndrome.  Are overweight.  Smoke cigarettes.  Do not get enough exercise.  Drink too much alcohol.  Eat a diet that is high in fat and red meat and low in fiber.  Had childhood cancer that was treated with abdominal radiation. What are the signs or symptoms? Most polyps do not cause symptoms. If you have symptoms, they may include:  Blood coming  from your rectum when having a bowel movement.  Blood in your stool. The stool may look dark red or black.  Abdominal pain.  A change in bowel habits, such as constipation or diarrhea. How is this diagnosed? This condition is diagnosed with a colonoscopy. This is a procedure in which a lighted, flexible scope is inserted into the anus and then passed into the colon to examine the area. Polyps are sometimes found when a colonoscopy is done as part of routine cancer screening tests. How is this treated? Treatment for this condition involves removing any polyps that are found. Most polyps can be removed during a colonoscopy. Those polyps will then be tested for cancer. Additional treatment may be needed depending on the results of testing. Follow these instructions at home: Lifestyle  Maintain a healthy weight, or lose weight if recommended by your health care provider.  Exercise every day or as told by your health care provider.  Do not use any products that contain nicotine or tobacco, such as cigarettes and e-cigarettes. If you need help quitting, ask your health care provider.  If you drink alcohol, limit how much you have: ? 0-1 drink a day for women. ? 0-2 drinks a day for men.  Be aware of how much alcohol is in your drink. In the [place] one drink equals one 12 oz bottle of beer (355 mL), one 5 oz glass of wine (148 mL), or one 1 oz shot of hard liquor (44 mL). Eating and drinking   Eat foods that are high in fiber, such as fruits, vegetables, and whole grains.  Eat foods that are high in calcium and vitamin D, such as milk, cheese, yogurt, eggs, liver, fish, and broccoli.  Limit foods that are high in fat, such as fried foods and desserts.  Limit the amount of red meat and processed meat you eat, such as hot dogs, sausage, bacon, and lunch meats. General instructions  Keep all follow-up visits as told by your health care provider. This is important. ? This includes having  regularly scheduled colonoscopies. ? Talk to your health care provider about when you need a colonoscopy. Contact a health care provider if:  You have new or worsening bleeding during a bowel movement.  You have new or increased blood in your stool.  You have a change in bowel habits.  You lose weight for no known reason. Summary  Polyps are tissue growths inside the body. Polyps can grow in many places, including the colon.  Most colon polyps are noncancerous (benign), but some can become cancerous over time.  This condition is diagnosed with a colonoscopy.  Treatment for this condition involves removing any polyps that are found. Most polyps can be removed during a colonoscopy. This information is not intended to replace advice given to you by your health care provider. Make sure you discuss any questions you have with your health care provider. Document Released: 05/28/2004 Document Revised: 12/17/2017 Document Reviewed: 12/17/2017  Elsevier Patient Education  [employer].

## 2019-06-15 NOTE — Op Note (Signed)
[facility] Patient Name: Debra George Procedure Date: 06/15/2019 7:09 AM MRN: [id number] Date of Birth: Apr 11, 1941 Attending MD: Debra George , MD CSN: [id number] Age: 78 Admit Type: Outpatient Procedure:                Colonoscopy Indications:              High risk colon cancer surveillance: Personal                            history of colonic polyps Providers:                Debra Richards, MD, Lurline Del, RN, Aram Candela Referring MD:              Medicines:                Midazolam 5 mg IV, Meperidine 40 mg IV Complications:            No immediate complications. Estimated Blood Loss:     Estimated blood loss was minimal. Procedure:                Pre-Anesthesia Assessment:                           - Prior to the procedure, a History and Physical                            was performed, and patient medications and                            allergies were reviewed. The patient's tolerance of                            previous anesthesia was also reviewed. The risks                            and benefits of the procedure and the sedation                            options and risks were discussed with the patient.                            All questions were answered, and informed consent                            was obtained. Prior Anticoagulants: The patient has                            taken no previous anticoagulant or antiplatelet                            agents. ASA Grade Assessment: II - A patient with  mild systemic disease. After reviewing the risks                            and benefits, the patient was deemed in                            satisfactory condition to undergo the procedure.                           After obtaining informed consent, the colonoscope                            was passed under direct vision. Throughout the                            procedure, the  patient's blood pressure, pulse, and                            oxygen saturations were monitored continuously. The                            CF-[id number]) scope was introduced through                            the anus and advanced to the the ileocolonic                            anastomosis. The colonoscopy was performed without                            difficulty. The patient tolerated the procedure                            well. The quality of the bowel preparation was                            adequate. Scope In: 7:38:49 AM Scope Out: 7:57:36 AM Scope Withdrawal Time: 0 hours 12 minutes 35 seconds  Total Procedure Duration: 0 hours 18 minutes 47 seconds  Findings:      The perianal and digital rectal examinations were normal.      Three semi-pedunculated polyps were found in the splenic flexure. The       polyps were 4 to 8 mm in size. These polyps were removed with a cold       snare. Resection and retrieval were complete. Estimated blood loss was       minimal. These polyps were removed with a cold snare. Resection and       retrieval were complete. Estimated blood loss was minimal.      The exam was otherwise without abnormality on direct and retroflexion       views. Impression:               - Three 4 to 8 mm polyps at the splenic flexure,  removed with a cold snare. Resected and retrieved.                            S/P right hemicolectomy                           - The examination was otherwise normal on direct                            and retroflexion views. Moderate Sedation:      Moderate (conscious) sedation was administered by the endoscopy nurse       and supervised by the endoscopist. The following parameters were       monitored: oxygen saturation, heart rate, blood pressure, respiratory       rate, EKG, adequacy of pulmonary ventilation, and response to care.       Total physician intraservice time was 25  minutes. Recommendation:           - Patient has a contact number available for                            emergencies. The signs and symptoms of potential                            delayed complications were discussed with the                            patient. Return to normal activities tomorrow.                            Written discharge instructions were provided to the                            patient.                           - Resume previous diet.                           - Continue present medications.                           - Repeat colonoscopy date to be determined after                            pending pathology results are reviewed for                            surveillance based on pathology results.                           - Return to GI office (date not yet determined). Procedure Code(s):        --- Professional ---                           [phone], Colonoscopy, flexible; with removal of  tumor(s), polyp(s), or other lesion(s) by snare                            technique                           99153, Moderate sedation; each additional 15                            minutes intraservice time                           G0500, Moderate sedation services provided by the                            same physician or other qualified health care                            professional performing a gastrointestinal                            endoscopic service that sedation supports,                            requiring the presence of an independent trained                            observer to assist in the monitoring of the                            patient's level of consciousness and physiological                            status; initial 15 minutes of intra-service time;                            patient age 57 years or older (additional time may                            be reported with [phone], as appropriate) Diagnosis  Code(s):        --- Professional ---                           Z86.010, Personal history of colonic polyps                           K63.5, Polyp of colon CPT copyright 2019 American Medical Association. All rights reserved. The codes documented in this report are preliminary and upon coder review may  be revised to meet current compliance requirements. Debra George. Debra Co, MD Debra Richards, MD 06/15/2019 8:15:38 AM This report has been signed electronically. Number of Addenda: 0

## 2019-06-15 NOTE — H&P (Signed)
@LOGO @   Primary Care Physician:  Mikey Kirschner, MD Primary Gastroenterologist:  Dr. Gala Romney  Pre-Procedure History & Physical: HPI:  Debra George is a 78 y.o. female here for surveillance colonoscopy.  History of multiple colonic adenomas removed over time.  No GI symptoms currently.  Past Medical History:  Diagnosis Date  . Macular degeneration     Past Surgical History:  Procedure Laterality Date  . APPENDECTOMY    . CATARACT EXTRACTION W/PHACO Left 11/27/2015   Procedure: CATARACT EXTRACTION PHACO AND INTRAOCULAR LENS PLACEMENT (IOC);  Surgeon: Rutherford Guys, MD;  Location: [facility];  Service: Ophthalmology;  Laterality: Left;  CDE:10.0  . CATARACT EXTRACTION W/PHACO Right 12/11/2015   Procedure: CATARACT EXTRACTION PHACO AND INTRAOCULAR LENS PLACEMENT (IOC);  Surgeon: Rutherford Guys, MD;  Location: [facility];  Service: Ophthalmology;  Laterality: Right;  CDE:8.61  . CESAREAN SECTION    . 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  . COLONOSCOPY  09/03/2005   RMR: normal rectum/dimintive polyp at the hepatic flexure cold bx  . COLONOSCOPY  2012   adenomatous appearing anastomosis but unremarkable biopsy  . COLONOSCOPY N/A 10/11/2015   Procedure: COLONOSCOPY;  Surgeon: Daneil Dolin, MD;  Location: [facility];  Service: Endoscopy;  Laterality: N/A;  200  . TUBAL LIGATION      Prior to Admission medications   Medication Sig Start Date End Date Taking? Authorizing Provider  acetaminophen (TYLENOL) 500 MG tablet Take 1,000 mg by mouth every 6 (six) hours as needed (for pain.).   Yes [provider]  calcium-vitamin D (OSCAL WITH D) 500-200 MG-UNIT per tablet Take 1 tablet by mouth 2 (two) times daily.     Yes [provider]  Multiple Vitamins-Minerals (PRESERVISION AREDS 2 PO) Take 1 tablet by mouth 2 (two) times daily.   Yes [provider]  polyethylene glycol-electrolytes (TRILYTE) 420 g solution Take 4,000 mLs by mouth as directed. 10/12/18  Yes  Mahala Menghini, PA-C    Allergies as of 10/12/2018 - Review Complete 10/12/2018  Allergen Reaction Noted  . Pneumococcal polysaccharide vaccine Swelling 10/12/2018    Family History  Problem Relation Age of Onset  . Congestive Heart Failure Mother   . Heart Problems Father   . COPD Brother   . Colon cancer Neg Hx     Social History   Socioeconomic History  . Marital status: Married    Spouse name: Not on file  . Number of children: 1  . Years of education: Not on file  . Highest education level: Not on file  Occupational History  . Not on file  Social Needs  . Financial resource strain: Not on file  . Food insecurity    Worry: Not on file    Inability: Not on file  . Transportation needs    Medical: Not on file    Non-medical: Not on file  Tobacco Use  . Smoking status: Former Research scientist (life sciences)  . Smokeless tobacco: Never Used  . Tobacco comment: social smoker, quit 1990  Substance and Sexual Activity  . Alcohol use: No    Alcohol/week: 0.0 standard drinks  . Drug use: No  . Sexual activity: Not on file  Lifestyle  . Physical activity    Days per week: Not on file    Minutes per session: Not on file  . Stress: Not on file  Relationships  . Social Herbalist on phone: Not on file    Gets together: Not  on file    Attends religious service: Not on file    Active member of club or organization: Not on file    Attends meetings of clubs or organizations: Not on file    Relationship status: Not on file  . Intimate partner violence    Fear of current or ex partner: Not on file    Emotionally abused: Not on file    Physically abused: Not on file    Forced sexual activity: Not on file  Other Topics Concern  . Not on file  Social History Narrative  . Not on file    Review of Systems: See HPI, otherwise negative ROS  Physical Exam: BP (!) 179/79   Pulse (!) 106   Temp 98.1 F (36.7 C) (Oral)   Resp (!) 22   Ht 5\' 8"  (1.727 m)   Wt 62.1 kg   SpO2 98%    BMI 20.83 kg/m  General:   Alert,  Well-developed, well-nourished, pleasant and cooperative in NAD  Neck:  Supple; no masses or thyromegaly. No significant cervical adenopathy. Lungs:  Clear throughout to auscultation.   No wheezes, crackles, or rhonchi. No acute distress. Heart:  Regular rate and rhythm; no murmurs, clicks, rubs,  or gallops. Abdomen: Non-distended, normal bowel sounds.  Soft and nontender without appreciable mass or hepatosplenomegaly.  Pulses:  Normal pulses noted. Extremities:  Without clubbing or edema.  Impression/Plan: 78 year old lady with a history of multiple advanced adenomas removed from her colon over time.  Here for surveillance colonoscopy. The risks, benefits, limitations, alternatives and imponderables have been reviewed with the patient. Questions have been answered. All parties are agreeable.      Notice: This dictation was prepared with Dragon dictation along with smaller phrase technology. Any transcriptional errors that result from this process are unintentional and may not be corrected upon review.

## 2019-06-16 ENCOUNTER — Encounter: Payer: Self-pay | Admitting: Internal Medicine

## 2019-06-16 LAB — SURGICAL PATHOLOGY

## 2019-06-18 ENCOUNTER — Other Ambulatory Visit (INDEPENDENT_AMBULATORY_CARE_PROVIDER_SITE_OTHER): Payer: Medicare Other | Admitting: *Deleted

## 2019-06-18 DIAGNOSIS — Z23 Encounter for immunization: Secondary | ICD-10-CM | POA: Diagnosis not present

## 2019-06-20 ENCOUNTER — Encounter (HOSPITAL_COMMUNITY): Payer: Self-pay | Admitting: Internal Medicine

## 2019-06-24 ENCOUNTER — Encounter (HOSPITAL_COMMUNITY): Payer: Self-pay | Admitting: Anesthesiology

## 2019-07-01 ENCOUNTER — Encounter: Payer: Self-pay | Admitting: Internal Medicine

## 2019-08-15 ENCOUNTER — Other Ambulatory Visit (HOSPITAL_COMMUNITY): Payer: Self-pay | Admitting: Family Medicine

## 2019-08-15 DIAGNOSIS — Z1231 Encounter for screening mammogram for malignant neoplasm of breast: Secondary | ICD-10-CM

## 2019-09-22 ENCOUNTER — Other Ambulatory Visit: Payer: Self-pay

## 2019-09-22 ENCOUNTER — Ambulatory Visit (HOSPITAL_COMMUNITY)
Admission: RE | Admit: 2019-09-22 | Discharge: 2019-09-22 | Disposition: A | Discharge status: Home / Self Care | Payer: Medicare Other | Source: Ambulatory Visit | Attending: Family Medicine | Admitting: Family Medicine

## 2019-09-22 DIAGNOSIS — Z1231 Encounter for screening mammogram for malignant neoplasm of breast: Secondary | ICD-10-CM | POA: Diagnosis not present

## 2019-09-23 ENCOUNTER — Ambulatory Visit (HOSPITAL_COMMUNITY): Payer: Medicare Other

## 2019-10-20 ENCOUNTER — Encounter: Payer: Self-pay | Admitting: Family Medicine

## 2019-11-21 ENCOUNTER — Other Ambulatory Visit: Payer: Self-pay | Admitting: Dermatology

## 2019-11-21 DIAGNOSIS — D485 Neoplasm of uncertain behavior of skin: Secondary | ICD-10-CM | POA: Diagnosis not present

## 2019-11-21 DIAGNOSIS — D229 Melanocytic nevi, unspecified: Secondary | ICD-10-CM | POA: Diagnosis not present

## 2019-11-21 DIAGNOSIS — L728 Other follicular cysts of the skin and subcutaneous tissue: Secondary | ICD-10-CM | POA: Diagnosis not present

## 2019-11-21 DIAGNOSIS — L821 Other seborrheic keratosis: Secondary | ICD-10-CM | POA: Diagnosis not present

## 2019-11-28 ENCOUNTER — Telehealth: Payer: Self-pay | Admitting: Dermatology

## 2019-11-28 NOTE — Telephone Encounter (Signed)
Patient call to get her pathology report from 11/21/2019

## 2019-11-29 NOTE — Telephone Encounter (Signed)
Patients pathology results given to her husband Abe People who is on her DPR.

## 2020-02-03 ENCOUNTER — Encounter: Payer: Self-pay | Admitting: Family Medicine

## 2020-02-03 ENCOUNTER — Other Ambulatory Visit: Payer: Self-pay

## 2020-02-03 ENCOUNTER — Ambulatory Visit (INDEPENDENT_AMBULATORY_CARE_PROVIDER_SITE_OTHER): Payer: Medicare Other | Admitting: Family Medicine

## 2020-02-03 VITALS — BP 138/80 | Temp 97.0°F | Ht 67.5 in | Wt 135.0 lb

## 2020-02-03 DIAGNOSIS — Z0000 Encounter for general adult medical examination without abnormal findings: Secondary | ICD-10-CM | POA: Diagnosis not present

## 2020-02-03 DIAGNOSIS — Z13220 Encounter for screening for lipoid disorders: Secondary | ICD-10-CM

## 2020-02-03 NOTE — Progress Notes (Signed)
   Subjective:    Patient ID: Debra George, female    DOB: 18-Apr-1941, 79 y.o.   MRN: [id number]  HPI AWV- Annual Wellness Visit  The patient was seen for their annual wellness visit. The patient's past medical history, surgical history, and family history were reviewed. Pertinent vaccines were reviewed ( tetanus, pneumonia, shingles, flu) The patient's medication list was reviewed and updated.  The height and weight were entered.  BMI recorded in electronic record elsewhere  Cognitive screening was completed. Outcome of Mini - Cog: pass   Falls /depression screening electronically recorded within record elsewhere  Current tobacco usage: none (All patients who use tobacco were given written and verbal information on quitting)  Recent listing of emergency department/hospitalizations over the past year were reviewed.  current specialist the patient sees on a regular basis: none   Medicare annual wellness visit patient questionnaire was reviewed.  A written screening schedule for the patient for the next 5-10 years was given. Appropriate discussion of followup regarding next visit was discussed.  Blister on right hand.   Fungus on right great toenail.   Diet overall good  Eating good variety of foods  Got the moderna vaccine     Review of Systems No headache, no major weight loss or weight gain, no chest pain no back pain abdominal pain no change in bowel habits complete ROS otherwise negative     Objective:   Physical Exam Vitals reviewed.  Constitutional:      Appearance: She is well-developed.  HENT:     Head: Normocephalic.     Right Ear: External ear normal.     Left Ear: External ear normal.  Eyes:     Pupils: Pupils are equal, round, and reactive to light.  Neck:     Thyroid: No thyromegaly.  Cardiovascular:     Rate and Rhythm: Normal rate and regular rhythm.     Heart sounds: Normal heart sounds. No murmur.  Pulmonary:     Effort: Pulmonary  effort is normal. No respiratory distress.     Breath sounds: Normal breath sounds. No wheezing.  Abdominal:     General: Bowel sounds are normal. There is no distension.     Palpations: Abdomen is soft. There is no mass.     Tenderness: There is no abdominal tenderness.  Musculoskeletal:        General: No tenderness. Normal range of motion.     Cervical back: Normal range of motion.  Lymphadenopathy:     Cervical: No cervical adenopathy.  Skin:    General: Skin is warm and dry.  Neurological:     Mental Status: She is alert and oriented to person, place, and time.     Motor: No abnormal muscle tone.  Psychiatric:        Behavior: Behavior normal.   Breast without masses.  Pelvic exam within normal limits        Assessment & Plan:  Impression wellness exam.  Has had 2 mode turned out Covid vaccines.  Last colon done in sept colonoscopy.  Mammogram in January normal.  Diet discussed.  Exercise discussed.  Blood pressure on repeat in good range.  Appropriate blood work ordered.  Has avulsion injury of the hand which is healing nicely.  1 toe with onychomycosis reassured and recommended no treatment

## 2020-02-06 DIAGNOSIS — Z0000 Encounter for general adult medical examination without abnormal findings: Secondary | ICD-10-CM | POA: Diagnosis not present

## 2020-02-06 DIAGNOSIS — Z13220 Encounter for screening for lipoid disorders: Secondary | ICD-10-CM | POA: Diagnosis not present

## 2020-02-07 LAB — BASIC METABOLIC PANEL
BUN/Creatinine Ratio: 17 (ref 12–28)
BUN: 14 mg/dL (ref 8–27)
CO2: 22 mmol/L (ref 20–29)
Calcium: 9.7 mg/dL (ref 8.7–10.3)
Chloride: 108 mmol/L — ABNORMAL HIGH (ref 96–106)
Creatinine, Ser: 0.83 mg/dL (ref 0.57–1.00)
GFR calc Af Amer: 78 mL/min/{1.73_m2} (ref >59)
GFR calc non Af Amer: 67 mL/min/{1.73_m2} (ref >59)
Glucose: 93 mg/dL (ref 65–99)
Potassium: 4.6 mmol/L (ref 3.5–5.2)
Sodium: 145 mmol/L — ABNORMAL HIGH (ref 134–144)

## 2020-02-07 LAB — LIPID PANEL
Chol/HDL Ratio: 3.1 ratio (ref 0.0–4.4)
Cholesterol, Total: 193 mg/dL (ref 100–199)
HDL: 63 mg/dL (ref >39)
LDL Chol Calc (NIH): 111 mg/dL — ABNORMAL HIGH (ref 0–99)
Triglycerides: 105 mg/dL (ref 0–149)
VLDL Cholesterol Cal: 19 mg/dL (ref 5–40)

## 2020-02-07 LAB — CBC WITH DIFFERENTIAL/PLATELET
Basophils Absolute: 0.1 10*3/uL (ref 0.0–0.2)
Basos: 1 %
EOS (ABSOLUTE): 0.1 10*3/uL (ref 0.0–0.4)
Eos: 2 %
Hematocrit: 40.9 % (ref 34.0–46.6)
Hemoglobin: 14.1 g/dL (ref 11.1–15.9)
Immature Grans (Abs): 0 10*3/uL (ref 0.0–0.1)
Immature Granulocytes: 0 %
Lymphocytes Absolute: 2.1 10*3/uL (ref 0.7–3.1)
Lymphs: 34 %
MCH: 32 pg (ref 26.6–33.0)
MCHC: 34.5 g/dL (ref 31.5–35.7)
MCV: 93 fL (ref 79–97)
Monocytes Absolute: 0.6 10*3/uL (ref 0.1–0.9)
Monocytes: 9 %
Neutrophils Absolute: 3.3 10*3/uL (ref 1.4–7.0)
Neutrophils: 54 %
Platelets: 166 10*3/uL (ref 150–450)
RBC: 4.4 x10E6/uL (ref 3.77–5.28)
RDW: 13 % (ref 11.7–15.4)
WBC: 6.2 10*3/uL (ref 3.4–10.8)

## 2020-02-07 LAB — HEPATIC FUNCTION PANEL
ALT: 14 IU/L (ref 0–32)
AST: 22 IU/L (ref 0–40)
Albumin: 4.5 g/dL (ref 3.7–4.7)
Alkaline Phosphatase: 73 IU/L (ref 48–121)
Bilirubin Total: 0.7 mg/dL (ref 0.0–1.2)
Bilirubin, Direct: 0.16 mg/dL (ref 0.00–0.40)
Total Protein: 6.6 g/dL (ref 6.0–8.5)

## 2020-02-11 ENCOUNTER — Encounter: Payer: Self-pay | Admitting: Family Medicine

## 2020-05-07 DIAGNOSIS — H04123 Dry eye syndrome of bilateral lacrimal glands: Secondary | ICD-10-CM | POA: Diagnosis not present

## 2020-05-07 DIAGNOSIS — H353131 Nonexudative age-related macular degeneration, bilateral, early dry stage: Secondary | ICD-10-CM | POA: Diagnosis not present

## 2020-05-07 DIAGNOSIS — Z961 Presence of intraocular lens: Secondary | ICD-10-CM | POA: Diagnosis not present

## 2020-07-10 ENCOUNTER — Other Ambulatory Visit: Payer: Self-pay

## 2020-07-10 ENCOUNTER — Ambulatory Visit (INDEPENDENT_AMBULATORY_CARE_PROVIDER_SITE_OTHER): Payer: Medicare Other | Admitting: Family Medicine

## 2020-07-10 DIAGNOSIS — J019 Acute sinusitis, unspecified: Secondary | ICD-10-CM | POA: Diagnosis not present

## 2020-07-10 DIAGNOSIS — R059 Cough, unspecified: Secondary | ICD-10-CM

## 2020-07-10 MED ORDER — AMOXICILLIN 500 MG PO CAPS: 500.0000 mg | ORAL_CAPSULE | Freq: Three times a day (TID) | ORAL | 0 refills | Status: DC

## 2020-07-10 NOTE — Progress Notes (Signed)
   Subjective:    Patient ID: Debra George, female    DOB: [birth year], 79 y.o.   MRN: [id number]  HPI Pt having drainage, cough, facial pressure and temp last night. Started about 2 weeks ago but improved with allergy meds. Started again last night.   Patient denies any severe wheezing or difficulty breathing but relates a lot of sinus pressure drainage coughing Review of Systems  Constitutional: Positive for fever. Negative for activity change.  HENT: Positive for congestion and rhinorrhea. Negative for ear pain.   Eyes: Negative for discharge.  Respiratory: Positive for cough. Negative for shortness of breath and wheezing.   Cardiovascular: Negative for chest pain.       Objective:   Physical Exam Mild sinus tenderness eardrums normal throat is normal lungs are clear heart is regular       Assessment & Plan:  Possible viral syndrome Covid test taken Antibiotics prescribed for probable sinus infection Warning signs discussed no x-rays lab work indicated if worse follow-up

## 2020-07-11 LAB — SPECIMEN STATUS REPORT

## 2020-07-11 LAB — NOVEL CORONAVIRUS, NAA: SARS-CoV-2, NAA: DETECTED — AB

## 2020-07-11 LAB — SARS-COV-2, NAA 2 DAY TAT

## 2020-07-12 ENCOUNTER — Other Ambulatory Visit: Payer: Self-pay | Admitting: Nurse Practitioner

## 2020-07-12 ENCOUNTER — Ambulatory Visit (HOSPITAL_COMMUNITY)
Admission: RE | Admit: 2020-07-12 | Discharge: 2020-07-12 | Disposition: A | Discharge status: Home / Self Care | Payer: Medicare Other | Source: Ambulatory Visit | Attending: Critical Care Medicine | Admitting: Critical Care Medicine

## 2020-07-12 DIAGNOSIS — U071 COVID-19: Secondary | ICD-10-CM | POA: Diagnosis present

## 2020-07-12 DIAGNOSIS — Z23 Encounter for immunization: Secondary | ICD-10-CM | POA: Insufficient documentation

## 2020-07-12 MED ORDER — EPINEPHRINE 0.3 MG/0.3ML IJ SOAJ: 0.3000 mg | Freq: Once | INTRAMUSCULAR | Status: DC | PRN

## 2020-07-12 MED ORDER — ALBUTEROL SULFATE HFA 108 (90 BASE) MCG/ACT IN AERS: 2.0000 | INHALATION_SPRAY | Freq: Once | RESPIRATORY_TRACT | Status: DC | PRN

## 2020-07-12 MED ORDER — DIPHENHYDRAMINE HCL 50 MG/ML IJ SOLN: 50.0000 mg | Freq: Once | INTRAMUSCULAR | Status: DC | PRN

## 2020-07-12 MED ORDER — METHYLPREDNISOLONE SODIUM SUCC 125 MG IJ SOLR
125.0000 mg | Freq: Once | INTRAMUSCULAR | Status: AC | PRN
  Administered 2020-07-12: 125 mg via INTRAVENOUS
  Filled 2020-07-12: qty 2

## 2020-07-12 MED ORDER — FAMOTIDINE IN NACL 20-0.9 MG/50ML-% IV SOLN: 20.0000 mg | Freq: Once | INTRAVENOUS | Status: DC | PRN

## 2020-07-12 MED ORDER — SODIUM CHLORIDE 0.9 % IV SOLN: Freq: Once | INTRAVENOUS | Status: AC

## 2020-07-12 MED ORDER — SODIUM CHLORIDE 0.9 % IV SOLN: INTRAVENOUS | Status: DC | PRN

## 2020-07-12 NOTE — Discharge Instructions (Signed)

## 2020-07-12 NOTE — Progress Notes (Signed)
I connected by phone with [name] on 07/12/2020 at 9:50 AM to discuss the potential use of an new treatment for mild to moderate COVID-19 viral infection in non-hospitalized patients.  This patient is a 79 y.o. female that meets the FDA criteria for Emergency Use Authorization of bamlanivimab/etesevimab or casirivimab\imdevimab.  Has a (+) direct SARS-CoV-2 viral test result  Has mild or moderate COVID-19   Is ? 80 years of age and weighs ? 40 kg  Is NOT hospitalized due to COVID-19  Is NOT requiring oxygen therapy or requiring an increase in baseline oxygen flow rate due to COVID-19  Is within 10 days of symptom onset  Has at least one of the high risk factor(s) for progression to severe COVID-19 and/or hospitalization as defined in EUA.  Specific high risk criteria : Older age (>/= 80 yo)   I have spoken and communicated the following to the patient or parent/caregiver:  1. FDA has authorized the emergency use of bamlanivimab/etesevimab and casirivimab\imdevimab for the treatment of mild to moderate COVID-19 in adults and pediatric patients with positive results of direct SARS-CoV-2 viral testing who are 64 years of age and older weighing at least 40 kg, and who are at high risk for progressing to severe COVID-19 and/or hospitalization.  2. The significant known and potential risks and benefits of bamlanivimab/etesevimab and casirivimab\imdevimab, and the extent to which such potential risks and benefits are unknown.  3. Information on available alternative treatments and the risks and benefits of those alternatives, including clinical trials.  4. Patients treated with bamlanivimab/etesevimab and casirivimab\imdevimab should continue to self-isolate and use infection control measures (e.g., wear mask, isolate, social distance, avoid sharing personal items, clean and disinfect "high touch" surfaces, and frequent handwashing) according to CDC guidelines.   5. The patient or  parent/caregiver has the option to accept or refuse bamlanivimab/etesevimab or casirivimab\imdevimab .  After reviewing this information with the patient, the patient has agreed to receive one of the available covid 19 monoclonal antibodies and will be provided an appropriate fact sheet prior to infusion.Beckey Rutter, [place], AGNP-C [phone] ([place])

## 2020-07-12 NOTE — Progress Notes (Signed)
  Diagnosis: COVID-19  Physician: Dr. Wright  Procedure: [facility] Med: bamlanivimab\etesevimab infusion - Provided patient with bamlanimivab\etesevimab fact sheet for patients, parents and caregivers prior to infusion.  Complications: No immediate complications noted.  Discharge: Discharged home   Debra George  [name] 07/12/2020  

## 2020-08-20 DIAGNOSIS — M4696 Unspecified inflammatory spondylopathy, lumbar region: Secondary | ICD-10-CM | POA: Diagnosis not present

## 2020-08-20 DIAGNOSIS — M5136 Other intervertebral disc degeneration, lumbar region: Secondary | ICD-10-CM | POA: Diagnosis not present

## 2020-08-20 DIAGNOSIS — M545 Low back pain, unspecified: Secondary | ICD-10-CM | POA: Diagnosis not present

## 2020-08-23 ENCOUNTER — Other Ambulatory Visit (HOSPITAL_COMMUNITY): Payer: Self-pay | Admitting: Family Medicine

## 2020-08-23 DIAGNOSIS — Z1231 Encounter for screening mammogram for malignant neoplasm of breast: Secondary | ICD-10-CM

## 2020-09-26 ENCOUNTER — Other Ambulatory Visit: Payer: Self-pay

## 2020-09-26 ENCOUNTER — Ambulatory Visit (HOSPITAL_COMMUNITY)
Admission: RE | Admit: 2020-09-26 | Discharge: 2020-09-26 | Disposition: A | Discharge status: Home / Self Care | Payer: Medicare Other | Source: Ambulatory Visit | Attending: Family Medicine | Admitting: Family Medicine

## 2020-09-26 DIAGNOSIS — Z1231 Encounter for screening mammogram for malignant neoplasm of breast: Secondary | ICD-10-CM | POA: Diagnosis not present

## 2021-01-17 ENCOUNTER — Telehealth: Payer: Self-pay | Admitting: Family Medicine

## 2021-01-17 DIAGNOSIS — Z13220 Encounter for screening for lipoid disorders: Secondary | ICD-10-CM

## 2021-01-17 DIAGNOSIS — Z0000 Encounter for general adult medical examination without abnormal findings: Secondary | ICD-10-CM

## 2021-01-17 NOTE — Telephone Encounter (Signed)
Last labs 02/06/20: Lipid, Liver, Met 7 CBC

## 2021-01-17 NOTE — Telephone Encounter (Signed)
Patient has physical on 5/23 and needing labs

## 2021-01-17 NOTE — Telephone Encounter (Signed)
Patient has physical

## 2021-01-17 NOTE — Telephone Encounter (Signed)
Yes pls order these. Thx. Dr. T  

## 2021-01-18 NOTE — Telephone Encounter (Signed)
Pt.notified

## 2021-01-18 NOTE — Telephone Encounter (Signed)
Left message to return call 

## 2021-01-22 DIAGNOSIS — E785 Hyperlipidemia, unspecified: Secondary | ICD-10-CM | POA: Diagnosis not present

## 2021-01-22 DIAGNOSIS — Z13220 Encounter for screening for lipoid disorders: Secondary | ICD-10-CM | POA: Diagnosis not present

## 2021-01-22 DIAGNOSIS — Z0000 Encounter for general adult medical examination without abnormal findings: Secondary | ICD-10-CM | POA: Diagnosis not present

## 2021-01-23 LAB — HEPATIC FUNCTION PANEL
ALT: 13 IU/L (ref 0–32)
AST: 23 IU/L (ref 0–40)
Albumin: 4.2 g/dL (ref 3.7–4.7)
Alkaline Phosphatase: 68 IU/L (ref 44–121)
Bilirubin Total: 0.8 mg/dL (ref 0.0–1.2)
Bilirubin, Direct: 0.19 mg/dL (ref 0.00–0.40)
Total Protein: 6.2 g/dL (ref 6.0–8.5)

## 2021-01-23 LAB — CBC WITH DIFFERENTIAL/PLATELET
Basophils Absolute: 0 10*3/uL (ref 0.0–0.2)
Basos: 1 %
EOS (ABSOLUTE): 0.1 10*3/uL (ref 0.0–0.4)
Eos: 2 %
Hematocrit: 39.4 % (ref 34.0–46.6)
Hemoglobin: 13.7 g/dL (ref 11.1–15.9)
Immature Grans (Abs): 0 10*3/uL (ref 0.0–0.1)
Immature Granulocytes: 0 %
Lymphocytes Absolute: 1.8 10*3/uL (ref 0.7–3.1)
Lymphs: 31 %
MCH: 31.9 pg (ref 26.6–33.0)
MCHC: 34.8 g/dL (ref 31.5–35.7)
MCV: 92 fL (ref 79–97)
Monocytes Absolute: 0.5 10*3/uL (ref 0.1–0.9)
Monocytes: 9 %
Neutrophils Absolute: 3.3 10*3/uL (ref 1.4–7.0)
Neutrophils: 57 %
Platelets: 149 10*3/uL — ABNORMAL LOW (ref 150–450)
RBC: 4.3 x10E6/uL (ref 3.77–5.28)
RDW: 13.3 % (ref 11.7–15.4)
WBC: 5.8 10*3/uL (ref 3.4–10.8)

## 2021-01-23 LAB — BASIC METABOLIC PANEL
BUN/Creatinine Ratio: 16 (ref 12–28)
BUN: 12 mg/dL (ref 8–27)
CO2: 23 mmol/L (ref 20–29)
Calcium: 9.2 mg/dL (ref 8.7–10.3)
Chloride: 107 mmol/L — ABNORMAL HIGH (ref 96–106)
Creatinine, Ser: 0.75 mg/dL (ref 0.57–1.00)
Glucose: 94 mg/dL (ref 65–99)
Potassium: 4.3 mmol/L (ref 3.5–5.2)
Sodium: 144 mmol/L (ref 134–144)
eGFR: 80 mL/min/{1.73_m2} (ref >59)

## 2021-01-23 LAB — LIPID PANEL
Chol/HDL Ratio: 2.8 ratio (ref 0.0–4.4)
Cholesterol, Total: 188 mg/dL (ref 100–199)
HDL: 68 mg/dL (ref >39)
LDL Chol Calc (NIH): 108 mg/dL — ABNORMAL HIGH (ref 0–99)
Triglycerides: 63 mg/dL (ref 0–149)
VLDL Cholesterol Cal: 12 mg/dL (ref 5–40)

## 2021-02-04 ENCOUNTER — Ambulatory Visit (INDEPENDENT_AMBULATORY_CARE_PROVIDER_SITE_OTHER): Payer: Medicare Other | Admitting: Family Medicine

## 2021-02-04 ENCOUNTER — Other Ambulatory Visit: Payer: Self-pay

## 2021-02-04 ENCOUNTER — Encounter: Payer: Self-pay | Admitting: Family Medicine

## 2021-02-04 VITALS — BP 172/82 | HR 98 | Temp 97.2°F | Ht 67.5 in | Wt 134.0 lb

## 2021-02-04 DIAGNOSIS — Z0000 Encounter for general adult medical examination without abnormal findings: Secondary | ICD-10-CM | POA: Diagnosis not present

## 2021-02-04 DIAGNOSIS — S46912A Strain of unspecified muscle, fascia and tendon at shoulder and upper arm level, left arm, initial encounter: Secondary | ICD-10-CM

## 2021-02-04 DIAGNOSIS — R030 Elevated blood-pressure reading, without diagnosis of hypertension: Secondary | ICD-10-CM | POA: Insufficient documentation

## 2021-02-04 DIAGNOSIS — I10 Essential (primary) hypertension: Secondary | ICD-10-CM

## 2021-02-04 DIAGNOSIS — S76312A Strain of muscle, fascia and tendon of the posterior muscle group at thigh level, left thigh, initial encounter: Secondary | ICD-10-CM

## 2021-02-04 MED ORDER — LISINOPRIL 10 MG PO TABS: 10.0000 mg | ORAL_TABLET | Freq: Every day | ORAL | 1 refills | Status: DC

## 2021-02-04 NOTE — Progress Notes (Signed)
Patient ID: Debra George, female    DOB: 1941/02/19, 80 y.o.   MRN: [id number]   Chief Complaint  Patient presents with  . Annual Exam    Left arm and left pain since doing heavy yard work 2 weeks - stopped doing daily walk due to pain    Subjective:  AWV- Annual Wellness Visit  The patient was seen for their annual wellness visit. The patient's past medical history, surgical history, and family history were reviewed. Pertinent vaccines were reviewed ( tetanus, pneumonia, shingles, flu) The patient's medication list was reviewed and updated.  The height and weight were entered.  BMI recorded in electronic record elsewhere  Cognitive screening was completed. Outcome of Mini - Cog: 5   Falls /depression screening electronically recorded within record elsewhere  Current tobacco usage:none (All patients who use tobacco were given written and verbal information on quitting)  Recent listing of emergency department/hospitalizations over the past year were reviewed.  current specialist the patient sees on a regular basis: none   Medicare annual wellness visit patient questionnaire was reviewed.  A written screening schedule for the patient for the next 5-10 years was given. Appropriate discussion of followup regarding next visit was discussed.  Had infusion for covid and feels her bp has gone up after that.  Had mild case of covid, and last about 2 wks.  07/12/20- infusion.  Usually 150s then come back down to 409 systolic.   At home seeing 120s.   - pain in hamstring of left thigh and left upper arm hurting with driving. 2 wks ago. Feeling she over did it.  Doing yard work and clipping hedges more than usual.  Usually walking daily, rested last 5 days due to feeling pain in left hamstring.  No falls or direct injury to left arm or leg. Not taking any meds for it. Pt stating, "its feeling much better but wanted to tell you about it."  HTN Pt compliant with BP  meds.  No SEs Denies chest pain, sob, LE swelling, headache, or blurry vision.  [place] has a past medical history of Macular degeneration.   Outpatient Encounter Medications as of 02/04/2021  Medication Sig  . calcium-vitamin D (OSCAL WITH D) 500-200 MG-UNIT per tablet Take 1 tablet by mouth 2 (two) times daily.  Marland Kitchen lisinopril (ZESTRIL) 10 MG tablet Take 1 tablet (10 mg total) by mouth daily.  . Multiple Vitamins-Minerals (PRESERVISION AREDS 2 PO) Take 1 tablet by mouth 2 (two) times daily.  . [DISCONTINUED] acetaminophen (TYLENOL) 500 MG tablet Take 1,000 mg by mouth every 6 (six) hours as needed (for pain.).  . [DISCONTINUED] amoxicillin (AMOXIL) 500 MG capsule Take 1 capsule (500 mg total) by mouth 3 (three) times daily.   No facility-administered encounter medications on file as of 02/04/2021.     Review of Systems  Constitutional: Negative for chills and fever.  HENT: Negative for congestion, rhinorrhea and sore throat.   Respiratory: Negative for cough, shortness of breath and wheezing.   Cardiovascular: Negative for chest pain and leg swelling.  Gastrointestinal: Negative for abdominal pain, diarrhea, nausea and vomiting.  Genitourinary: Negative for dysuria and frequency.  Musculoskeletal: Positive for arthralgias (left arm and left thigh pain). Negative for back pain.  Skin: Negative for rash.  Neurological: Negative for dizziness, weakness and headaches.     Vitals BP (!) 172/82   Pulse 98   Temp (!) 97.2 F (36.2 C)   Ht 5' 7.5" (1.715 m)  Wt 134 lb (60.8 kg)   SpO2 98%   BMI 20.68 kg/m   Objective:   Physical Exam Vitals and nursing note reviewed.  Constitutional:      General: She is not in acute distress.    Appearance: Normal appearance. She is not ill-appearing.  HENT:     Head: Normocephalic and atraumatic.     Right Ear: Tympanic membrane, ear canal and external ear normal.     Left Ear: Tympanic membrane, ear canal and external  ear normal.     Nose: Nose normal. No congestion.     Mouth/Throat:     Mouth: Mucous membranes are moist.     Pharynx: Oropharynx is clear. No oropharyngeal exudate or posterior oropharyngeal erythema.  Eyes:     Extraocular Movements: Extraocular movements intact.     Conjunctiva/sclera: Conjunctivae normal.     Pupils: Pupils are equal, round, and reactive to light.  Cardiovascular:     Rate and Rhythm: Normal rate and regular rhythm.     Pulses: Normal pulses.     Heart sounds: Normal heart sounds.  Pulmonary:     Effort: Pulmonary effort is normal.     Breath sounds: Normal breath sounds. No wheezing, rhonchi or rales.  Abdominal:     General: Abdomen is flat. Bowel sounds are normal. There is no distension.     Palpations: Abdomen is soft. There is no mass.     Tenderness: There is no abdominal tenderness. There is no guarding or rebound.     Hernia: No hernia is present.  Musculoskeletal:        General: No swelling, tenderness, deformity or signs of injury. Normal range of motion.     Cervical back: Normal range of motion.     Right lower leg: No edema.     Left lower leg: No edema.     Comments: Normal rom of left arm and left leg.   Skin:    General: Skin is warm and dry.     Findings: No lesion or rash.  Neurological:     General: No focal deficit present.     Mental Status: She is alert and oriented to person, place, and time.     Cranial Nerves: No cranial nerve deficit.     Motor: No weakness.     Gait: Gait normal.  Psychiatric:        Mood and Affect: Mood normal.        Behavior: Behavior normal.        Thought Content: Thought content normal.        Judgment: Judgment normal.    Assessment and Plan   1. Medicare annual wellness visit, subsequent  2. Muscle strain of left upper arm, initial encounter  3. Hamstring strain, left, initial encounter  4. Hypertension, unspecified type - lisinopril (ZESTRIL) 10 MG tablet; Take 1 tablet (10 mg total) by  mouth daily.  Dispense: 90 tablet; Refill: 1   Hamstring strain and left upper arm strain- rest, heat/ice, stretches and tylenol or ibuprofen prn.  Call or rto if not improving in next 1-2 wks. Pt in agreement.  BP very elevated- pt stating has "white coat syndrome"  That at home it's 381-829H systolic. Pt to recheck at home and  Call if seeing numbers over 145/85 or above.  Pt in agreement with plan.  HM- reviewed.  Return in about 6 months (around 08/07/2021) for f/u BP.   02/18/2021  BP Readings from Last 3 Encounters:  02/04/21 (!) 172/82  07/12/20 (!) 199/80  02/03/20 138/80     

## 2021-02-04 NOTE — Patient Instructions (Signed)
Check bp daily and take lisinopril if 140/85 or above.

## 2021-02-25 DIAGNOSIS — M545 Low back pain, unspecified: Secondary | ICD-10-CM | POA: Diagnosis not present

## 2021-02-25 DIAGNOSIS — M7542 Impingement syndrome of left shoulder: Secondary | ICD-10-CM | POA: Diagnosis not present

## 2021-02-25 DIAGNOSIS — M5136 Other intervertebral disc degeneration, lumbar region: Secondary | ICD-10-CM | POA: Diagnosis not present

## 2021-03-07 DIAGNOSIS — M25512 Pain in left shoulder: Secondary | ICD-10-CM | POA: Diagnosis not present

## 2021-03-07 DIAGNOSIS — R2689 Other abnormalities of gait and mobility: Secondary | ICD-10-CM | POA: Diagnosis not present

## 2021-03-07 DIAGNOSIS — M7542 Impingement syndrome of left shoulder: Secondary | ICD-10-CM | POA: Diagnosis not present

## 2021-03-07 DIAGNOSIS — M25652 Stiffness of left hip, not elsewhere classified: Secondary | ICD-10-CM | POA: Diagnosis not present

## 2021-03-07 DIAGNOSIS — M7522 Bicipital tendinitis, left shoulder: Secondary | ICD-10-CM | POA: Diagnosis not present

## 2021-03-07 DIAGNOSIS — R531 Weakness: Secondary | ICD-10-CM | POA: Diagnosis not present

## 2021-03-07 DIAGNOSIS — S76312D Strain of muscle, fascia and tendon of the posterior muscle group at thigh level, left thigh, subsequent encounter: Secondary | ICD-10-CM | POA: Diagnosis not present

## 2021-03-11 DIAGNOSIS — S76312D Strain of muscle, fascia and tendon of the posterior muscle group at thigh level, left thigh, subsequent encounter: Secondary | ICD-10-CM | POA: Diagnosis not present

## 2021-03-11 DIAGNOSIS — R531 Weakness: Secondary | ICD-10-CM | POA: Diagnosis not present

## 2021-03-11 DIAGNOSIS — M7522 Bicipital tendinitis, left shoulder: Secondary | ICD-10-CM | POA: Diagnosis not present

## 2021-03-11 DIAGNOSIS — R2689 Other abnormalities of gait and mobility: Secondary | ICD-10-CM | POA: Diagnosis not present

## 2021-03-11 DIAGNOSIS — M25652 Stiffness of left hip, not elsewhere classified: Secondary | ICD-10-CM | POA: Diagnosis not present

## 2021-03-11 DIAGNOSIS — M7542 Impingement syndrome of left shoulder: Secondary | ICD-10-CM | POA: Diagnosis not present

## 2021-03-11 DIAGNOSIS — M25512 Pain in left shoulder: Secondary | ICD-10-CM | POA: Diagnosis not present

## 2021-03-14 DIAGNOSIS — S76312D Strain of muscle, fascia and tendon of the posterior muscle group at thigh level, left thigh, subsequent encounter: Secondary | ICD-10-CM | POA: Diagnosis not present

## 2021-03-14 DIAGNOSIS — M25652 Stiffness of left hip, not elsewhere classified: Secondary | ICD-10-CM | POA: Diagnosis not present

## 2021-03-14 DIAGNOSIS — M7542 Impingement syndrome of left shoulder: Secondary | ICD-10-CM | POA: Diagnosis not present

## 2021-03-14 DIAGNOSIS — M25512 Pain in left shoulder: Secondary | ICD-10-CM | POA: Diagnosis not present

## 2021-03-14 DIAGNOSIS — R2689 Other abnormalities of gait and mobility: Secondary | ICD-10-CM | POA: Diagnosis not present

## 2021-03-14 DIAGNOSIS — M7522 Bicipital tendinitis, left shoulder: Secondary | ICD-10-CM | POA: Diagnosis not present

## 2021-03-14 DIAGNOSIS — R531 Weakness: Secondary | ICD-10-CM | POA: Diagnosis not present

## 2021-03-19 DIAGNOSIS — M7542 Impingement syndrome of left shoulder: Secondary | ICD-10-CM | POA: Diagnosis not present

## 2021-03-19 DIAGNOSIS — M25512 Pain in left shoulder: Secondary | ICD-10-CM | POA: Diagnosis not present

## 2021-03-19 DIAGNOSIS — S76312D Strain of muscle, fascia and tendon of the posterior muscle group at thigh level, left thigh, subsequent encounter: Secondary | ICD-10-CM | POA: Diagnosis not present

## 2021-03-19 DIAGNOSIS — M25652 Stiffness of left hip, not elsewhere classified: Secondary | ICD-10-CM | POA: Diagnosis not present

## 2021-03-19 DIAGNOSIS — R2689 Other abnormalities of gait and mobility: Secondary | ICD-10-CM | POA: Diagnosis not present

## 2021-03-19 DIAGNOSIS — M7522 Bicipital tendinitis, left shoulder: Secondary | ICD-10-CM | POA: Diagnosis not present

## 2021-03-19 DIAGNOSIS — R531 Weakness: Secondary | ICD-10-CM | POA: Diagnosis not present

## 2021-03-21 DIAGNOSIS — R2689 Other abnormalities of gait and mobility: Secondary | ICD-10-CM | POA: Diagnosis not present

## 2021-03-21 DIAGNOSIS — S76312D Strain of muscle, fascia and tendon of the posterior muscle group at thigh level, left thigh, subsequent encounter: Secondary | ICD-10-CM | POA: Diagnosis not present

## 2021-03-21 DIAGNOSIS — R531 Weakness: Secondary | ICD-10-CM | POA: Diagnosis not present

## 2021-03-21 DIAGNOSIS — M25652 Stiffness of left hip, not elsewhere classified: Secondary | ICD-10-CM | POA: Diagnosis not present

## 2021-03-21 DIAGNOSIS — M25512 Pain in left shoulder: Secondary | ICD-10-CM | POA: Diagnosis not present

## 2021-03-21 DIAGNOSIS — M7542 Impingement syndrome of left shoulder: Secondary | ICD-10-CM | POA: Diagnosis not present

## 2021-03-21 DIAGNOSIS — M7522 Bicipital tendinitis, left shoulder: Secondary | ICD-10-CM | POA: Diagnosis not present

## 2021-03-25 ENCOUNTER — Encounter: Payer: Self-pay | Admitting: Family Medicine

## 2021-03-25 DIAGNOSIS — E785 Hyperlipidemia, unspecified: Secondary | ICD-10-CM | POA: Insufficient documentation

## 2021-03-27 DIAGNOSIS — M7542 Impingement syndrome of left shoulder: Secondary | ICD-10-CM | POA: Diagnosis not present

## 2021-03-27 DIAGNOSIS — S76312D Strain of muscle, fascia and tendon of the posterior muscle group at thigh level, left thigh, subsequent encounter: Secondary | ICD-10-CM | POA: Diagnosis not present

## 2021-03-27 DIAGNOSIS — M25512 Pain in left shoulder: Secondary | ICD-10-CM | POA: Diagnosis not present

## 2021-03-27 DIAGNOSIS — M25652 Stiffness of left hip, not elsewhere classified: Secondary | ICD-10-CM | POA: Diagnosis not present

## 2021-03-27 DIAGNOSIS — R2689 Other abnormalities of gait and mobility: Secondary | ICD-10-CM | POA: Diagnosis not present

## 2021-03-27 DIAGNOSIS — R531 Weakness: Secondary | ICD-10-CM | POA: Diagnosis not present

## 2021-03-27 DIAGNOSIS — M7522 Bicipital tendinitis, left shoulder: Secondary | ICD-10-CM | POA: Diagnosis not present

## 2021-04-02 DIAGNOSIS — M7542 Impingement syndrome of left shoulder: Secondary | ICD-10-CM | POA: Diagnosis not present

## 2021-04-02 DIAGNOSIS — M7522 Bicipital tendinitis, left shoulder: Secondary | ICD-10-CM | POA: Diagnosis not present

## 2021-04-02 DIAGNOSIS — M25652 Stiffness of left hip, not elsewhere classified: Secondary | ICD-10-CM | POA: Diagnosis not present

## 2021-04-02 DIAGNOSIS — S76312D Strain of muscle, fascia and tendon of the posterior muscle group at thigh level, left thigh, subsequent encounter: Secondary | ICD-10-CM | POA: Diagnosis not present

## 2021-04-02 DIAGNOSIS — R2689 Other abnormalities of gait and mobility: Secondary | ICD-10-CM | POA: Diagnosis not present

## 2021-04-02 DIAGNOSIS — R531 Weakness: Secondary | ICD-10-CM | POA: Diagnosis not present

## 2021-04-02 DIAGNOSIS — M25512 Pain in left shoulder: Secondary | ICD-10-CM | POA: Diagnosis not present

## 2021-04-11 DIAGNOSIS — Z23 Encounter for immunization: Secondary | ICD-10-CM | POA: Diagnosis not present

## 2021-05-08 DIAGNOSIS — H353131 Nonexudative age-related macular degeneration, bilateral, early dry stage: Secondary | ICD-10-CM | POA: Diagnosis not present

## 2021-05-08 DIAGNOSIS — Z961 Presence of intraocular lens: Secondary | ICD-10-CM | POA: Diagnosis not present

## 2021-05-08 DIAGNOSIS — H04123 Dry eye syndrome of bilateral lacrimal glands: Secondary | ICD-10-CM | POA: Diagnosis not present

## 2021-06-05 ENCOUNTER — Ambulatory Visit: Payer: Medicare Other | Admitting: Dermatology

## 2021-06-05 ENCOUNTER — Encounter: Payer: Self-pay | Admitting: Dermatology

## 2021-06-05 ENCOUNTER — Other Ambulatory Visit: Payer: Self-pay

## 2021-06-05 DIAGNOSIS — L820 Inflamed seborrheic keratosis: Secondary | ICD-10-CM

## 2021-06-05 DIAGNOSIS — D692 Other nonthrombocytopenic purpura: Secondary | ICD-10-CM | POA: Diagnosis not present

## 2021-06-05 DIAGNOSIS — D485 Neoplasm of uncertain behavior of skin: Secondary | ICD-10-CM

## 2021-06-05 DIAGNOSIS — L821 Other seborrheic keratosis: Secondary | ICD-10-CM

## 2021-06-05 MED ORDER — DERMEND BRUISE FORMULA EX CREA: 1.0000 "application " | TOPICAL_CREAM | Freq: Every day | CUTANEOUS | 0 refills | Status: DC

## 2021-06-05 NOTE — Patient Instructions (Addendum)
Biopsy, Surgery (Curettage) & Surgery (Excision) Aftercare Instructions  1. Okay to remove bandage in 24 hours  2. Wash area with soap and water  3. Apply Vaseline to area twice daily until healed (Not Neosporin)  4. Okay to cover with a Band-Aid to decrease the chance of infection or prevent irritation from clothing; also it's okay to uncover lesion at home.  5. Suture instructions: return to our office in 7-10 or 10-14 days for a nurse visit for suture removal. Variable healing with sutures, if pain or itching occurs call our office. It's okay to shower or bathe 24 hours after sutures are given.  6. The following risks may occur after a biopsy, curettage or excision: bleeding, scarring, discoloration, recurrence, infection (redness, yellow drainage, pain or swelling).  7. For questions, concerns and results call our office at [place] before 4pm & Friday before 3pm. Biopsy results will be available in 1 week.   Get Dermend for bruising.

## 2021-06-18 ENCOUNTER — Telehealth: Payer: Self-pay | Admitting: Dermatology

## 2021-06-18 NOTE — Telephone Encounter (Signed)
Patient returned call about pathology results from last visit with Lavonna Monarch, M.D.

## 2021-06-18 NOTE — Telephone Encounter (Signed)
Left message with husband to call back for results.

## 2021-06-18 NOTE — Telephone Encounter (Signed)
Results, ST 

## 2021-06-19 NOTE — Telephone Encounter (Signed)
Phone call from patient wanting her pathology results. Patient aware of results.

## 2021-06-19 NOTE — Telephone Encounter (Signed)
Patient left message on office voice mail that she was returning call from yesterday about pathology results.

## 2021-06-21 ENCOUNTER — Encounter: Payer: Self-pay | Admitting: Dermatology

## 2021-06-21 NOTE — Progress Notes (Signed)
   Follow-Up Visit   Subjective  Debra George is a 80 y.o. female who presents for the following: Skin Problem (Patient here today for lesion on her right posterior ear x 1 year. No bleeding, no pain. ).  Growth and spot behind right ear, general skin check. Location:  Duration:  Quality:  Associated Signs/Symptoms: Modifying Factors:  Severity:  Timing: Context:   Objective  Well appearing patient in no apparent distress; mood and affect are within normal limits. Left Supraclavicular Area, Torso - Posterior (Back) Multiple brown textured flattopped 4 to 7 mm papules  Right Postauricular Area Pink crusted 8 mm papule, superficial carcinoma versus I SK       Left Forearm - Posterior, Right Forearm - Posterior Multiple 1 cm ecchymoses distal to elbow.  Denies history of other abnormal bleeding.    All sun exposed areas plus back examined.   Assessment & Plan    Seborrheic keratosis (2) Torso - Posterior (Back); Left Supraclavicular Area  Leave if stable  Neoplasm of uncertain behavior of skin Right Postauricular Area  Skin / nail biopsy Type of biopsy: tangential   Informed consent: discussed and consent obtained   Timeout: patient name, date of birth, surgical site, and procedure verified   Procedure prep:  Patient was prepped and draped in usual sterile fashion (Non sterile) Prep type:  Chlorhexidine Anesthesia: the lesion was anesthetized in a standard fashion   Anesthetic:  1% lidocaine w/ epinephrine 1-100,000 local infiltration Instrument used: flexible razor blade   Hemostasis achieved with: ferric subsulfate and electrodesiccation   Outcome: patient tolerated procedure well   Post-procedure details: sterile dressing applied and wound care instructions given   Dressing type: bandage and petrolatum    Specimen 1 - Surgical pathology Differential Diagnosis: R/O ISK - cautery after biopsy  Check Margins: No  Solar purpura ([place]) (2) Left  Forearm - Posterior; Right Forearm - Posterior  May try over-the-counter dermend applied to areas prone to bruise daily after bathing.  Emollient (DERMEND BRUISE FORMULA) CREA - Left Forearm - Posterior, Right Forearm - Posterior Apply 1 application topically daily.      I, Lavonna Monarch, MD, have reviewed all documentation for this visit.  The documentation on 06/21/21 for the exam, diagnosis, procedures, and orders are all accurate and complete.

## 2021-07-18 DIAGNOSIS — Z23 Encounter for immunization: Secondary | ICD-10-CM | POA: Diagnosis not present

## 2021-07-24 ENCOUNTER — Ambulatory Visit (INDEPENDENT_AMBULATORY_CARE_PROVIDER_SITE_OTHER): Payer: Medicare Other | Admitting: Family Medicine

## 2021-07-24 ENCOUNTER — Other Ambulatory Visit: Payer: Self-pay

## 2021-07-24 VITALS — BP 172/73 | HR 100 | Temp 97.3°F | Ht 67.5 in | Wt 131.0 lb

## 2021-07-24 DIAGNOSIS — M8580 Other specified disorders of bone density and structure, unspecified site: Secondary | ICD-10-CM

## 2021-07-24 DIAGNOSIS — R030 Elevated blood-pressure reading, without diagnosis of hypertension: Secondary | ICD-10-CM

## 2021-07-24 DIAGNOSIS — E7800 Pure hypercholesterolemia, unspecified: Secondary | ICD-10-CM

## 2021-07-24 NOTE — Assessment & Plan Note (Signed)
LDL slightly elevated at 108.  Given her age and the fact that she is doing so well, we will just continue to monitor.  No pharmacotherapy at this time.

## 2021-07-24 NOTE — Patient Instructions (Signed)
We will set up the bone density.  Follow up annually.  Take care  Dr. Lacinda Axon

## 2021-07-24 NOTE — Assessment & Plan Note (Signed)
BP's well controlled at home.  Will continue to monitor.  Discontinuing lisinopril.

## 2021-07-24 NOTE — Progress Notes (Signed)
Subjective:  Patient ID: Debra George, female    DOB: 09/21/1940  Age: 80 y.o. MRN: [id number]  CC: Chief Complaint  Patient presents with   Establish Care    HPI: 80 year old female presents with establish care with me.   Patient states she is doing well. Walks regularly. No concerns at this time.  Elevated BP Patient states that her blood pressures are elevated in the doctor's office but are well controlled at home.  She states that her blood pressures are typically in the 412I systolic. She is not taking the lisinopril that was previously prescribed.  She feels that she is doing well.  Hyperlipidemia Most recent LDL was slightly elevated at 108.  Patient is on no pharmacotherapy at this time.  Osteopenia Noted on DEXA scan 2 years ago.  Is due for another.  We will discuss this today.   Patient Active Problem List   Diagnosis Date Noted   Osteopenia 07/24/2021   Hyperlipidemia 03/25/2021   White coat syndrome without hypertension 02/04/2021   H/O adenomatous polyp of colon 10/02/2015    Social Hx   Social History   Socioeconomic History   Marital status: Married    Spouse name: Not on file   Number of children: 1   Years of education: Not on file   Highest education level: Not on file  Occupational History   Not on file  Tobacco Use   Smoking status: Former   Smokeless tobacco: Never   Tobacco comments:    social smoker, quit 1990  Substance and Sexual Activity   Alcohol use: No    Alcohol/week: 0.0 standard drinks   Drug use: No   Sexual activity: Not on file  Other Topics Concern   Not on file  Social History Narrative   Not on file   Social Determinants of Health   Financial Resource Strain: Not on file  Food Insecurity: Not on file  Transportation Needs: Not on file  Physical Activity: Not on file  Stress: Not on file  Social Connections: Not on file    Review of Systems  Constitutional: Negative.   Respiratory: Negative.     Cardiovascular: Negative.   Gastrointestinal: Negative.     Objective:  BP (!) 172/73   Pulse 100   Temp (!) 97.3 F (36.3 C)   Ht 5' 7.5" (1.715 m)   Wt 131 lb (59.4 kg)   SpO2 99%   BMI 20.21 kg/m   BP/Weight 07/24/2021 02/04/2021 78/67/6720  Systolic BP 947 096 283  Diastolic BP 73 82 80  Wt. (Lbs) 131 134 -  BMI 20.21 20.68 -    Physical Exam Vitals and nursing note reviewed.  Constitutional:      General: She is not in acute distress.    Appearance: Normal appearance. She is not ill-appearing.  HENT:     Head: Normocephalic and atraumatic.  Eyes:     General:        Right eye: No discharge.        Left eye: No discharge.     Conjunctiva/sclera: Conjunctivae normal.  Cardiovascular:     Rate and Rhythm: Normal rate and regular rhythm.  Pulmonary:     Effort: Pulmonary effort is normal.     Breath sounds: Normal breath sounds. No wheezing, rhonchi or rales.  Abdominal:     General: There is no distension.     Palpations: Abdomen is soft.     Tenderness: There is no abdominal  tenderness.  Musculoskeletal:     Cervical back: Neck supple.  Lymphadenopathy:     Cervical: No cervical adenopathy.  Neurological:     Mental Status: She is alert.  Psychiatric:        Mood and Affect: Mood normal.        Behavior: Behavior normal.    Lab Results  Component Value Date   WBC 5.8 01/22/2021   HGB 13.7 01/22/2021   HCT 39.4 01/22/2021   PLT 149 (L) 01/22/2021   GLUCOSE 94 01/22/2021   CHOL 188 01/22/2021   TRIG 63 01/22/2021   HDL 68 01/22/2021   LDLCALC 108 (H) 01/22/2021   ALT 13 01/22/2021   AST 23 01/22/2021   NA 144 01/22/2021   K 4.3 01/22/2021   CL 107 (H) 01/22/2021   CREATININE 0.75 01/22/2021   BUN 12 01/22/2021   CO2 23 01/22/2021     Assessment & Plan:   Problem List Items Addressed This Visit       Cardiovascular and Mediastinum   White coat syndrome without hypertension - Primary    BP's well controlled at home.  Will continue to  monitor.  Discontinuing lisinopril.        Musculoskeletal and Integument   Osteopenia    Ordering Bone density.       Relevant Orders   DG Bone Density     Other   Hyperlipidemia    LDL slightly elevated at 108.  Given her age and the fact that she is doing so well, we will just continue to monitor.  No pharmacotherapy at this time.       Follow-up:  Return in about 1 year (around 07/24/2022) for Annual physical.  Thersa Salt DO [facility]

## 2021-07-24 NOTE — Assessment & Plan Note (Signed)
Ordering Bone density.

## 2021-08-19 ENCOUNTER — Ambulatory Visit: Payer: Medicare Other | Admitting: Dermatology

## 2021-08-26 ENCOUNTER — Other Ambulatory Visit (HOSPITAL_COMMUNITY): Payer: Self-pay | Admitting: Family Medicine

## 2021-08-26 DIAGNOSIS — Z1231 Encounter for screening mammogram for malignant neoplasm of breast: Secondary | ICD-10-CM

## 2021-10-07 ENCOUNTER — Ambulatory Visit (HOSPITAL_COMMUNITY)
Admission: RE | Admit: 2021-10-07 | Discharge: 2021-10-07 | Disposition: A | Discharge status: Home / Self Care | Payer: Medicare Other | Source: Ambulatory Visit | Attending: Family Medicine | Admitting: Family Medicine

## 2021-10-07 ENCOUNTER — Other Ambulatory Visit: Payer: Self-pay

## 2021-10-07 DIAGNOSIS — Z780 Asymptomatic menopausal state: Secondary | ICD-10-CM | POA: Insufficient documentation

## 2021-10-07 DIAGNOSIS — M810 Age-related osteoporosis without current pathological fracture: Secondary | ICD-10-CM | POA: Diagnosis not present

## 2021-10-07 DIAGNOSIS — Z13820 Encounter for screening for osteoporosis: Secondary | ICD-10-CM | POA: Diagnosis not present

## 2021-10-07 DIAGNOSIS — M8580 Other specified disorders of bone density and structure, unspecified site: Secondary | ICD-10-CM | POA: Insufficient documentation

## 2021-10-07 DIAGNOSIS — Z1231 Encounter for screening mammogram for malignant neoplasm of breast: Secondary | ICD-10-CM | POA: Insufficient documentation

## 2021-10-09 ENCOUNTER — Ambulatory Visit (INDEPENDENT_AMBULATORY_CARE_PROVIDER_SITE_OTHER): Payer: Medicare Other | Admitting: Family Medicine

## 2021-10-09 ENCOUNTER — Other Ambulatory Visit: Payer: Self-pay

## 2021-10-09 ENCOUNTER — Encounter: Payer: Self-pay | Admitting: Family Medicine

## 2021-10-09 DIAGNOSIS — M810 Age-related osteoporosis without current pathological fracture: Secondary | ICD-10-CM | POA: Diagnosis not present

## 2021-10-09 NOTE — Patient Instructions (Addendum)
Keep up the exercise and the calcium and vitamin D.  Follow up at towards the end of the year.  Take care  Dr. Lacinda Axon

## 2021-10-09 NOTE — Progress Notes (Signed)
Subjective:  Patient ID: Debra George, female    DOB: [birth year]  Age: 81 y.o. MRN: [id number]  CC: Chief Complaint  Patient presents with   Discussion    Pt here to follow up on recent bone density test     HPI:  81 year old female presents to discuss recent bone density test and possible treatment.  Patient recently had a bone density test which revealed osteoporosis.  She is taking calcium and vitamin D.  She is exercising 5 times a week.  She walks 5 miles each time.  Patient states that she has had issues with her stomach and bowels intermittently.  She has occasional constipation.  Uses [employer].  We will discuss results and treatment options today.  Patient Active Problem List   Diagnosis Date Noted   Osteoporosis 10/09/2021   Hyperlipidemia 03/25/2021   White coat syndrome without hypertension 02/04/2021   H/O adenomatous polyp of colon 10/02/2015    Social Hx   Social History   Socioeconomic History   Marital status: Married    Spouse name: Not on file   Number of children: 1   Years of education: Not on file   Highest education level: Not on file  Occupational History   Not on file  Tobacco Use   Smoking status: Former   Smokeless tobacco: Never   Tobacco comments:    social smoker, quit 1990  Substance and Sexual Activity   Alcohol use: No    Alcohol/week: 0.0 standard drinks   Drug use: No   Sexual activity: Not on file  Other Topics Concern   Not on file  Social History Narrative   Not on file   Social Determinants of Health   Financial Resource Strain: Not on file  Food Insecurity: Not on file  Transportation Needs: Not on file  Physical Activity: Not on file  Stress: Not on file  Social Connections: Not on file    Review of Systems Per HPI  Objective:  BP 130/80    Pulse 90    Temp 98.1 F (36.7 C)    Wt 135 lb 3.2 oz (61.3 kg)    SpO2 99%    BMI 20.86 kg/m   BP/Weight 10/09/2021 07/24/2021 8/93/8101  Systolic BP 751 025 852   Diastolic BP 80 73 82  Wt. (Lbs) 135.2 131 134  BMI 20.86 20.21 20.68    Physical Exam Constitutional:      General: She is not in acute distress.    Appearance: Normal appearance.  HENT:     Head: Normocephalic and atraumatic.  Cardiovascular:     Rate and Rhythm: Normal rate and regular rhythm.  Pulmonary:     Effort: Pulmonary effort is normal.     Breath sounds: Normal breath sounds. No wheezing or rales.  Neurological:     Mental Status: She is alert.  Psychiatric:        Mood and Affect: Mood normal.        Behavior: Behavior normal.    Lab Results  Component Value Date   WBC 5.8 01/22/2021   HGB 13.7 01/22/2021   HCT 39.4 01/22/2021   PLT 149 (L) 01/22/2021   GLUCOSE 94 01/22/2021   CHOL 188 01/22/2021   TRIG 63 01/22/2021   HDL 68 01/22/2021   LDLCALC 108 (H) 01/22/2021   ALT 13 01/22/2021   AST 23 01/22/2021   NA 144 01/22/2021   K 4.3 01/22/2021   CL 107 (H) 01/22/2021   CREATININE  0.75 01/22/2021   BUN 12 01/22/2021   CO2 23 01/22/2021     Assessment & Plan:   Problem List Items Addressed This Visit       Musculoskeletal and Integument   Osteoporosis    I had a lengthy discussion with the patient today regarding watchful waiting versus treatment with Fosamax.  We also discussed vitamin D and calcium intake.  Patient does not want treatment at this time.  She states that she will continue to stay active.  We will monitor bone density every 2 years with DEXA scan.  She is to continue her vitamin D and calcium intake.      [place]

## 2021-10-09 NOTE — Assessment & Plan Note (Signed)
I had a lengthy discussion with the patient today regarding watchful waiting versus treatment with Fosamax.  We also discussed vitamin D and calcium intake.  Patient does not want treatment at this time.  She states that she will continue to stay active.  We will monitor bone density every 2 years with DEXA scan.  She is to continue her vitamin D and calcium intake.

## 2022-02-10 NOTE — Patient Instructions (Incomplete)
Debra George , Thank you for taking time to come for your Medicare Wellness Visit. I appreciate your ongoing commitment to your health goals. Please review the following plan we discussed and let me know if I can assist you in the future.   Screening recommendations/referrals: Colonoscopy: Done 06/15/2019 No longer required. Mammogram: Done 10/07/2021. Repeat annually  Bone Density: Done 10/07/2021. Repeat every 2 years  Recommended yearly ophthalmology/optometry visit for glaucoma screening and checkup Recommended yearly dental visit for hygiene and checkup  Vaccinations: Influenza vaccine: Done 06/24/2021 Repeat annually Pneumococcal vaccine: Done 09/05/2014 and 09/21/2018 Tdap vaccine: Due Repeat in 10 years  Shingles vaccine: Discussed.   Covid-19:Done 07/18/2021, 07/17/2020, 10/25/19, 04/11/21 AND 02/06/22.  Advanced directives: Please bring a copy of your health care power of attorney and living will to the office to be added to your chart at your convenience.   Conditions/risks identified: Aim for 30 minutes of exercise or brisk walking, 6-8 glasses of water, and 5 servings of fruits and vegetables each day. KEEP UP THE GOOD WORK!!   Next appointment: Follow up in one year for your annual wellness visit 2024.  FOLLOW UP WITH DR. COOK Thursday 02/13/22 @ 3:30 PM.    Preventive Care 65 Years and Older, Female Preventive care refers to lifestyle choices and visits with your health care provider that can promote health and wellness. What does preventive care include? A yearly physical exam. This is also called an annual well check. Dental exams once or twice a year. Routine eye exams. Ask your health care provider how often you should have your eyes checked. Personal lifestyle choices, including: Daily care of your teeth and gums. Regular physical activity. Eating a healthy diet. Avoiding tobacco and drug use. Limiting alcohol use. Practicing safe sex. Taking low-dose aspirin every  day. Taking vitamin and mineral supplements as recommended by your health care provider. What happens during an annual well check? The services and screenings done by your health care provider during your annual well check will depend on your age, overall health, lifestyle risk factors, and family history of disease. Counseling  Your health care provider may ask you questions about your: Alcohol use. Tobacco use. Drug use. Emotional well-being. Home and relationship well-being. Sexual activity. Eating habits. History of falls. Memory and ability to understand (cognition). Work and work Statistician. Reproductive health. Screening  You may have the following tests or measurements: Height, weight, and BMI. Blood pressure. Lipid and cholesterol levels. These may be checked every 5 years, or more frequently if you are over 31 years old. Skin check. Lung cancer screening. You may have this screening every year starting at age 74 if you have a 30-pack-year history of smoking and currently smoke or have quit within the past 15 years. Fecal occult blood test (FOBT) of the stool. You may have this test every year starting at age 42. Flexible sigmoidoscopy or colonoscopy. You may have a sigmoidoscopy every 5 years or a colonoscopy every 10 years starting at age 74. Hepatitis C blood test. Hepatitis B blood test. Sexually transmitted disease (STD) testing. Diabetes screening. This is done by checking your blood sugar (glucose) after you have not eaten for a while (fasting). You may have this done every 1-3 years. Bone density scan. This is done to screen for osteoporosis. You may have this done starting at age 12. Mammogram. This may be done every 1-2 years. Talk to your health care provider about how often you should have regular mammograms. Talk with your health  care provider about your test results, treatment options, and if necessary, the need for more tests. Vaccines  Your health care  provider may recommend certain vaccines, such as: Influenza vaccine. This is recommended every year. Tetanus, diphtheria, and acellular pertussis (Tdap, Td) vaccine. You may need a Td booster every 10 years. Zoster vaccine. You may need this after age 34. Pneumococcal 13-valent conjugate (PCV13) vaccine. One dose is recommended after age 53. Pneumococcal polysaccharide (PPSV23) vaccine. One dose is recommended after age 14. Talk to your health care provider about which screenings and vaccines you need and how often you need them. This information is not intended to replace advice given to you by your health care provider. Make sure you discuss any questions you have with your health care provider. Document Released: 09/28/2015 Document Revised: 05/21/2016 Document Reviewed: 07/03/2015 Elsevier Interactive Patient Education  2017 [place] Prevention in the Home Falls can cause injuries. They can happen to people of all ages. There are many things you can do to make your home safe and to help prevent falls. What can I do on the outside of my home? Regularly fix the edges of walkways and driveways and fix any cracks. Remove anything that might make you trip as you walk through a door, such as a raised step or threshold. Trim any bushes or trees on the path to your home. Use bright outdoor lighting. Clear any walking paths of anything that might make someone trip, such as rocks or tools. Regularly check to see if handrails are loose or broken. Make sure that both sides of any steps have handrails. Any raised decks and porches should have guardrails on the edges. Have any leaves, snow, or ice cleared regularly. Use sand or salt on walking paths during winter. Clean up any spills in your garage right away. This includes oil or grease spills. What can I do in the bathroom? Use night lights. Install grab bars by the toilet and in the tub and shower. Do not use towel bars as grab  bars. Use non-skid mats or decals in the tub or shower. If you need to sit down in the shower, use a plastic, non-slip stool. Keep the floor dry. Clean up any water that spills on the floor as soon as it happens. Remove soap buildup in the tub or shower regularly. Attach bath mats securely with double-sided non-slip rug tape. Do not have throw rugs and other things on the floor that can make you trip. What can I do in the bedroom? Use night lights. Make sure that you have a light by your bed that is easy to reach. Do not use any sheets or blankets that are too big for your bed. They should not hang down onto the floor. Have a firm chair that has side arms. You can use this for support while you get dressed. Do not have throw rugs and other things on the floor that can make you trip. What can I do in the kitchen? Clean up any spills right away. Avoid walking on wet floors. Keep items that you use a lot in easy-to-reach places. If you need to reach something above you, use a strong step stool that has a grab bar. Keep electrical cords out of the way. Do not use floor polish or wax that makes floors slippery. If you must use wax, use non-skid floor wax. Do not have throw rugs and other things on the floor that can make you trip. What can  I do with my stairs? Do not leave any items on the stairs. Make sure that there are handrails on both sides of the stairs and use them. Fix handrails that are broken or loose. Make sure that handrails are as long as the stairways. Check any carpeting to make sure that it is firmly attached to the stairs. Fix any carpet that is loose or worn. Avoid having throw rugs at the top or bottom of the stairs. If you do have throw rugs, attach them to the floor with carpet tape. Make sure that you have a light switch at the top of the stairs and the bottom of the stairs. If you do not have them, ask someone to add them for you. What else can I do to help prevent  falls? Wear shoes that: Do not have high heels. Have rubber bottoms. Are comfortable and fit you well. Are closed at the toe. Do not wear sandals. If you use a stepladder: Make sure that it is fully opened. Do not climb a closed stepladder. Make sure that both sides of the stepladder are locked into place. Ask someone to hold it for you, if possible. Clearly mark and make sure that you can see: Any grab bars or handrails. First and last steps. Where the edge of each step is. Use tools that help you move around (mobility aids) if they are needed. These include: Canes. Walkers. Scooters. Crutches. Turn on the lights when you go into a dark area. Replace any light bulbs as soon as they burn out. Set up your furniture so you have a clear path. Avoid moving your furniture around. If any of your floors are uneven, fix them. If there are any pets around you, be aware of where they are. Review your medicines with your doctor. Some medicines can make you feel dizzy. This can increase your chance of falling. Ask your doctor what other things that you can do to help prevent falls. This information is not intended to replace advice given to you by your health care provider. Make sure you discuss any questions you have with your health care provider. Document Released: 06/28/2009 Document Revised: 02/07/2016 Document Reviewed: 10/06/2014 Elsevier Interactive Patient Education  2017 [employer].

## 2022-02-11 ENCOUNTER — Ambulatory Visit (INDEPENDENT_AMBULATORY_CARE_PROVIDER_SITE_OTHER): Payer: Medicare Other

## 2022-02-11 VITALS — BP 136/74 | HR 76 | Ht 67.5 in | Wt 137.4 lb

## 2022-02-11 DIAGNOSIS — Z0000 Encounter for general adult medical examination without abnormal findings: Secondary | ICD-10-CM

## 2022-02-11 NOTE — Progress Notes (Signed)
Subjective:   Debra George is a 81 y.o. female who presents for Medicare Annual (Subsequent) preventive examination.  Review of Systems     Cardiac Risk Factors include: advanced age (>47mn, >>1women);hypertension;dyslipidemia     Objective:    Today's Vitals   02/11/22 1008  BP: 136/74  Pulse: 76  SpO2: 99%  Weight: 137 lb 6.4 oz (62.3 kg)  Height: 5' 7.5" (1.715 m)   Body mass index is 21.2 kg/m.     02/11/2022   10:23 AM 06/15/2019    6:37 AM 12/11/2015    6:42 AM 11/20/2015    1:55 PM 10/11/2015   12:55 PM  Advanced Directives  Does Patient Have a Medical Advance Directive? Yes No No No Yes  Type of AParamed[place]of [place] will    Living will  Copy of [place]? No - copy requested    No - copy requested  Would patient like information on creating a medical advance directive?  No - Patient declined No - patient declined information No - patient declined information     Current Medications (verified) Outpatient Encounter Medications as of 02/11/2022  Medication Sig   calcium-vitamin D (OSCAL WITH D) 500-200 MG-UNIT per tablet Take 1 tablet by mouth 2 (two) times daily.   Emollient (DERMEND BRUISE FORMULA) CREA Apply 1 application topically daily.   lisinopril (ZESTRIL) 10 MG tablet Take 1 tablet (10 mg total) by mouth daily.   Multiple Vitamins-Minerals (PRESERVISION AREDS 2 PO) Take 1 tablet by mouth 2 (two) times daily.   No facility-administered encounter medications on file as of 02/11/2022.    Allergies (verified) Pneumococcal polysaccharide vaccine   History: Past Medical History:  Diagnosis Date   Macular degeneration    Past Surgical History:  Procedure Laterality Date   APPENDECTOMY     CATARACT EXTRACTION W/PHACO Left 11/27/2015   Procedure: CATARACT EXTRACTION PHACO AND INTRAOCULAR LENS PLACEMENT (IOC);  Surgeon: MRutherford Guys MD;  Location: [facility];  Service: Ophthalmology;   Laterality: Left;  CDE:10.0   CATARACT EXTRACTION W/PHACO Right 12/11/2015   Procedure: CATARACT EXTRACTION PHACO AND INTRAOCULAR LENS PLACEMENT (IOC);  Surgeon: MRutherford Guys MD;  Location: [facility];  Service: Ophthalmology;  Laterality: Right;  CDE:8.61   CESAREAN SECTION     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   COLONOSCOPY  09/03/2005   RMR: normal rectum/dimintive polyp at the hepatic flexure cold bx   COLONOSCOPY  2012   adenomatous appearing anastomosis but unremarkable biopsy   COLONOSCOPY N/A 10/11/2015   Procedure: COLONOSCOPY;  Surgeon: RDaneil Dolin MD;  Location: [facility];  Service: Endoscopy;  Laterality: N/A;  200   COLONOSCOPY N/A 06/15/2019   Procedure: COLONOSCOPY;  Surgeon: RDaneil Dolin MD;  Location: [facility];  Service: Endoscopy;  Laterality: N/A;  8:45   POLYPECTOMY  06/15/2019   Procedure: POLYPECTOMY;  Surgeon: RDaneil Dolin MD;  Location: [facility];  Service: Endoscopy;;  splenic flexure   TUBAL LIGATION     Family History  Problem Relation Age of Onset   Congestive Heart Failure Mother    Heart Problems Father    COPD Brother    Colon cancer Neg Hx    Social History   Socioeconomic History   Marital status: Married    Spouse name: Bill   Number of children: 1   Years of education: Not on file   Highest education level: Not on file  Occupational History   Not on file  Tobacco Use   Smoking status: Former   Smokeless tobacco: Never   Tobacco comments:    social smoker, quit 1990  Substance and Sexual Activity   Alcohol use: No    Alcohol/week: 0.0 standard drinks   Drug use: No   Sexual activity: Not on file  Other Topics Concern   Not on file  Social History Narrative   Lives with husband Rush Landmark.   Son also lives with patient.    Walks 2 miles per day, 5 days per week.    Social Determinants of Health   Financial Resource Strain: Low Risk    Difficulty of Paying Living Expenses: Not very hard  Food Insecurity: No Food  Insecurity   Worried About Charity fundraiser in the Last Year: Never true   Ran Out of Food in the Last Year: Never true  Transportation Needs: No Transportation Needs   Lack of Transportation (Medical): No   Lack of Transportation (Non-Medical): No  Physical Activity: Sufficiently Active   Days of Exercise per Week: 5 days   Minutes of Exercise per Session: 40 min  Stress: No Stress Concern Present   Feeling of Stress : Not at all  Social Connections: Socially Integrated   Frequency of Communication with Friends and Family: More than three times a week   Frequency of Social Gatherings with Friends and Family: More than three times a week   Attends Religious Services: More than 4 times per year   Active Member of [employer] or Organizations: Yes   Attends Music therapist: More than 4 times per year   Marital Status: Married    Tobacco Counseling Counseling given: Not Answered Tobacco comments: social smoker, quit 1990   Clinical Intake:  Pre-visit preparation completed: Yes  Pain : No/denies pain     BMI - recorded: 21.2 Nutritional Status: BMI of 19-24  Normal Nutritional Risks: None Diabetes: No  How often do you need to have someone help you when you read instructions, pamphlets, or other written materials from your doctor or pharmacy?: 1 - Never  Diabetic?NO  Interpreter Needed?: No  Information entered by :: mj Aleister Lady, lpn   Activities of Daily Living    02/11/2022   10:27 AM  In your present state of health, do you have any difficulty performing the following activities:  Hearing? 0  Vision? 0  Difficulty concentrating or making decisions? 0  Walking or climbing stairs? 0  Dressing or bathing? 0  Doing errands, shopping? 0  Preparing Food and eating ? N  Using the Toilet? N  In the past six months, have you accidently leaked urine? N  Do you have problems with loss of bowel control? N  Managing your Medications? N  Managing your Finances?  N  Housekeeping or managing your Housekeeping? N    Patient Care Team: Coral Spikes, DO as PCP - General (Family Medicine) Gala Romney Cristopher Estimable, MD as Consulting Physician (Gastroenterology) Lavonna Monarch, MD as Consulting Physician (Dermatology)  Indicate any recent Medical Services you may have received from other than [facility] providers in the past year (date may be approximate).     Assessment:   This is a routine wellness examination for [place].  Hearing/Vision screen Hearing Screening - Comments:: No hearing issues.  Vision Screening - Comments:: Glasses. Dr. Katy Fitch. 04/2021.  Dietary issues and exercise activities discussed: Current Exercise Habits: Home exercise routine, Type of exercise: walking, Time (Minutes): 40, Frequency (  Times/Week): 5, Weekly Exercise (Minutes/Week): 200, Intensity: Mild, Exercise limited by: cardiac condition(s)   Goals Addressed             This Visit's Progress    Exercise 3x per week (30 min per time)       Continue to stay active and eat healthy.       Depression Screen    02/11/2022   10:18 AM 07/24/2021   10:54 AM 02/04/2021   10:22 AM 09/21/2018    9:20 AM 09/16/2016    1:01 PM 09/17/2015   10:41 AM  PHQ 2/9 Scores  PHQ - 2 Score 0 0 0 0 1 1  PHQ- 9 Score    0      Fall Risk    02/11/2022   10:25 AM 07/24/2021   10:53 AM 02/04/2021   10:23 AM 02/03/2020    1:36 PM 09/16/2016    1:01 PM  Fall Risk   Falls in the past year? 0 0 0 0 No  Number falls in past yr: 0 0 0    Injury with Fall? 0 0 0    Risk for fall due to : No Fall Risks No Fall Risks     Follow up Falls prevention discussed Falls evaluation completed Falls evaluation completed Falls evaluation completed     [place]:  Any stairs in or around the home? No  If so, are there any without handrails? No  Home free of loose throw rugs in walkways, pet beds, electrical cords, etc? Yes  Adequate lighting in your home to reduce risk of falls? No    ASSISTIVE DEVICES UTILIZED TO PREVENT FALLS:  Life alert? No  Use of a cane, walker or w/c? No  Grab bars in the bathroom? No  Shower chair or bench in shower? No  Elevated toilet seat or a handicapped toilet? Yes   TIMED UP AND GO:  Was the test performed? Yes .  Length of time to ambulate 10 feet: 10 sec.   Gait steady and fast without use of assistive device  Cognitive Function:        02/11/2022   10:29 AM  6CIT Screen  What Year? 0 points  What month? 0 points  What time? 0 points  Count back from 20 0 points  Months in reverse 0 points  Repeat phrase 0 points  Total Score 0 points    Immunizations Immunization History  Administered Date(s) Administered   Influenza,inj,Quad PF,6+ Mos 06/27/2015, 07/14/2016, 06/30/2017, 07/01/2018, 06/18/2019   Influenza-Unspecified 07/16/2013, 06/18/2020, 06/24/2021   Moderna SARS-COV2 Booster Vaccination 10/17/2020   Moderna Sars-Covid-2 Vaccination 10/25/2019, 07/17/2020, 07/18/2021   Pneumococcal Conjugate-13 09/05/2014   Pneumococcal Polysaccharide-23 09/21/2018    TDAP status: Due, Education has been provided regarding the importance of this vaccine. Advised may receive this vaccine at local pharmacy or Health Dept. Aware to provide a copy of the vaccination record if obtained from local pharmacy or Health Dept. Verbalized acceptance and understanding.  Flu Vaccine status: Up to date  Pneumococcal vaccine status: Up to date  Covid-19 vaccine status: Completed vaccines  Qualifies for Shingles Vaccine? Yes   Zostavax completed No   Shingrix Completed?: No.    Education has been provided regarding the importance of this vaccine. Patient has been advised to call insurance company to determine out of pocket expense if they have not yet received this vaccine. Advised may also receive vaccine at local pharmacy or Health Dept. Verbalized acceptance  and understanding.  Screening Tests Health Maintenance  Topic Date Due    COVID-19 Vaccine (4 - Booster for Moderna series) 09/12/2021   Zoster Vaccines- Shingrix (1 of 2) 05/14/2022 (Originally 07/09/1960)   TETANUS/TDAP  09/12/2022 (Originally 07/09/1960)   INFLUENZA VACCINE  04/15/2022   Pneumonia Vaccine 69+ Years old  Completed   DEXA SCAN  Completed   HPV VACCINES  Aged Out    Health Maintenance  Health Maintenance Due  Topic Date Due   COVID-19 Vaccine (4 - Booster for Moderna series) 09/12/2021    Colorectal cancer screening: No longer required.   Mammogram status: Completed 10/07/2021. Repeat every year  Bone Density status: Completed 10/07/2021. Results reflect: Bone density results: OSTEOPOROSIS. Repeat every 2 years.  Lung Cancer Screening: (Low Dose CT Chest recommended if Age 52-80 years, 30 pack-year currently smoking OR have quit w/in 15years.) does not qualify.   Additional Screening:  Hepatitis C Screening: does not qualify;   Vision Screening: Recommended annual ophthalmology exams for early detection of glaucoma and other disorders of the eye. Is the patient up to date with their annual eye exam?  Yes  Who is the provider or what is the name of the office in which the patient attends annual eye exams? Dr. Katy Fitch If pt is not established with a provider, would they like to be referred to a provider to establish care? No .   Dental Screening: Recommended annual dental exams for proper oral hygiene  Community Resource Referral / Chronic Care Management: CRR required this visit?  No   CCM required this visit?  No      Plan:     I have personally reviewed and noted the following in the patient's chart:   Medical and social history Use of alcohol, tobacco or illicit drugs  Current medications and supplements including opioid prescriptions.  Functional ability and status Nutritional status Physical activity Advanced directives List of other physicians Hospitalizations, surgeries, and ER visits in previous 12  months Vitals Screenings to include cognitive, depression, and falls Referrals and appointments  In addition, I have reviewed and discussed with patient certain preventive protocols, quality metrics, and best practice recommendations. A written personalized care plan for preventive services as well as general preventive health recommendations were provided to patient.     Chriss Driver, LPN   01/09/622   Nurse Notes: Discussed Shingrix and pt declines at this time.

## 2022-02-13 ENCOUNTER — Ambulatory Visit (INDEPENDENT_AMBULATORY_CARE_PROVIDER_SITE_OTHER): Payer: Medicare Other | Admitting: Family Medicine

## 2022-02-13 ENCOUNTER — Encounter: Payer: Self-pay | Admitting: Family Medicine

## 2022-02-13 DIAGNOSIS — S81801A Unspecified open wound, right lower leg, initial encounter: Secondary | ICD-10-CM | POA: Diagnosis not present

## 2022-02-13 MED ORDER — MUPIROCIN 2 % EX OINT: 1.0000 "application " | TOPICAL_OINTMENT | Freq: Three times a day (TID) | CUTANEOUS | 0 refills | Status: AC

## 2022-02-13 NOTE — Patient Instructions (Signed)
Keep the area clean.  Medication as directed.  Take care  Dr. Lacinda Axon

## 2022-02-13 NOTE — Progress Notes (Signed)
Subjective:  Patient ID: Debra George, female    DOB: 04-13-1941  Age: 81 y.o. MRN: [id number]  CC: Chief Complaint  Patient presents with   Sore on Leg    Pt has sore on lower right leg; puffy on the inner side. No pain or tenderness.  Possible left toe bunion.     HPI:  81 year old female presents for evaluation of the above.  Unsure of injury. Noticed an area of bleeding to the right lower leg several days ago. She reports that she has been cleaning the area daily with peroxide. She states that there has been surrounding redness but this is improved. No fever. No other associated symptoms.   Patient Active Problem List   Diagnosis Date Noted   Wound of right leg, initial encounter 02/13/2022   Osteoporosis 10/09/2021   Hyperlipidemia 03/25/2021   White coat syndrome without hypertension 02/04/2021   H/O adenomatous polyp of colon 10/02/2015    Social Hx   Social History   Socioeconomic History   Marital status: Married    Spouse name: Engineer, technical sales   Number of children: 1   Years of education: Not on file   Highest education level: Not on file  Occupational History   Not on file  Tobacco Use   Smoking status: Former   Smokeless tobacco: Never   Tobacco comments:    social smoker, quit 1990  Substance and Sexual Activity   Alcohol use: No    Alcohol/week: 0.0 standard drinks   Drug use: No   Sexual activity: Not on file  Other Topics Concern   Not on file  Social History Narrative   Lives with husband [place].   Son also lives with patient.    Walks 2 miles per day, 5 days per week.    Social Determinants of Health   Financial Resource Strain: Low Risk    Difficulty of Paying Living Expenses: Not very hard  Food Insecurity: No Food Insecurity   Worried About Charity fundraiser in the Last Year: Never true   Ran Out of Food in the Last Year: Never true  Transportation Needs: No Transportation Needs   Lack of Transportation (Medical): No   Lack of  Transportation (Non-Medical): No  Physical Activity: Sufficiently Active   Days of Exercise per Week: 5 days   Minutes of Exercise per Session: 40 min  Stress: No Stress Concern Present   Feeling of Stress : Not at all  Social Connections: Socially Integrated   Frequency of Communication with Friends and Family: More than three times a week   Frequency of Social Gatherings with Friends and Family: More than three times a week   Attends Religious Services: More than 4 times per year   Active Member of [employer] or Organizations: Yes   Attends Music therapist: More than 4 times per year   Marital Status: Married    Review of Systems Per HPI  Objective:  BP (!) 173/83   Pulse 85   Temp 98.1 F (36.7 C)   Wt 136 lb 9.6 oz (62 kg)   SpO2 99%   BMI 21.08 kg/m      02/13/2022    3:36 PM 02/11/2022   10:08 AM 10/09/2021    1:57 PM  BP/Weight  Systolic BP 694 854 627  Diastolic BP 83 74 80  Wt. (Lbs) 136.6 137.4 135.2  BMI 21.08 kg/m2 21.2 kg/m2 20.86 kg/m2    Physical Exam Vitals and nursing  note reviewed.  Constitutional:      General: She is not in acute distress.    Appearance: Normal appearance. She is not ill-appearing.  HENT:     Head: Normocephalic and atraumatic.  Eyes:     General:        Right eye: No discharge.        Left eye: No discharge.     Conjunctiva/sclera: Conjunctivae normal.  Skin:    Comments: Small healing wound to the right lower leg with eschar. Minimal surrounding erythema.   Neurological:     Mental Status: She is alert.    Lab Results  Component Value Date   WBC 5.8 01/22/2021   HGB 13.7 01/22/2021   HCT 39.4 01/22/2021   PLT 149 (L) 01/22/2021   GLUCOSE 94 01/22/2021   CHOL 188 01/22/2021   TRIG 63 01/22/2021   HDL 68 01/22/2021   LDLCALC 108 (H) 01/22/2021   ALT 13 01/22/2021   AST 23 01/22/2021   NA 144 01/22/2021   K 4.3 01/22/2021   CL 107 (H) 01/22/2021   CREATININE 0.75 01/22/2021   BUN 12 01/22/2021   CO2  23 01/22/2021     Assessment & Plan:   Problem List Items Addressed This Visit       Other   Wound of right leg, initial encounter    Advised to stop using peroxide. Keep clean with soap and water.  Topical Bactroban as prescribed.         Meds ordered this encounter  Medications   mupirocin ointment (BACTROBAN) 2 %    Sig: Apply 1 application. topically 3 (three) times daily for 7 days.    Dispense:  30 g    Refill:  [place]

## 2022-02-13 NOTE — Assessment & Plan Note (Signed)
Advised to stop using peroxide. Keep clean with soap and water.  Topical Bactroban as prescribed.

## 2022-06-17 ENCOUNTER — Ambulatory Visit: Payer: Medicare Other

## 2022-07-02 ENCOUNTER — Ambulatory Visit (INDEPENDENT_AMBULATORY_CARE_PROVIDER_SITE_OTHER): Payer: Medicare Other | Admitting: Family Medicine

## 2022-07-02 VITALS — BP 187/83 | HR 97 | Temp 99.7°F | Ht 67.5 in | Wt 131.0 lb

## 2022-07-02 DIAGNOSIS — R509 Fever, unspecified: Secondary | ICD-10-CM

## 2022-07-02 DIAGNOSIS — J069 Acute upper respiratory infection, unspecified: Secondary | ICD-10-CM

## 2022-07-02 NOTE — Progress Notes (Signed)
   Subjective:    Patient ID: Debra George, female    DOB: September 10, 1941, 81 y.o.   MRN: [id number]  HPI  C/o low grade fever, nasal congestion, nausea occasional, headache, cough - dry going on x 2 days- took tylenol at noon today Viral-like illness Runny nose cough low-grade fever Recently was at the hospital frequently with her husband who is in the hospital Review of Systems     Objective:   Physical Exam Lungs clear heart regular HEENT benign  Feel the patient needs chest x-ray currently     Assessment & Plan:  Febrile illness Viral syndrome Triple swab Rest up I do not feel the patient needs CBC or chest x-ray currently

## 2022-07-04 ENCOUNTER — Other Ambulatory Visit: Payer: Self-pay | Admitting: Family Medicine

## 2022-07-04 ENCOUNTER — Telehealth: Payer: Self-pay | Admitting: Family Medicine

## 2022-07-04 LAB — COVID-19, FLU A+B AND RSV
Influenza A, NAA: NOT DETECTED
Influenza B, NAA: NOT DETECTED
RSV, NAA: NOT DETECTED
SARS-CoV-2, NAA: DETECTED — AB

## 2022-07-04 LAB — SPECIMEN STATUS REPORT

## 2022-07-04 MED ORDER — MOLNUPIRAVIR EUA 200MG CAPSULE: 4.0000 | ORAL_CAPSULE | Freq: Two times a day (BID) | ORAL | 0 refills | Status: DC

## 2022-07-04 MED ORDER — MOLNUPIRAVIR EUA 200MG CAPSULE: 4.0000 | ORAL_CAPSULE | Freq: Two times a day (BID) | ORAL | 0 refills | Status: AC

## 2022-07-04 NOTE — Telephone Encounter (Signed)
Test results are not back at this time. Pt contacted and verbalized understanding.

## 2022-07-04 NOTE — Telephone Encounter (Signed)
Patient was seen 10/18 and wanting results of her test.

## 2022-07-04 NOTE — Telephone Encounter (Signed)
COVID positive. Apothecary did not have medicine available. Rx sent to [facility].  Debra George

## 2022-07-08 NOTE — Progress Notes (Signed)
Message sent to front staff

## 2022-09-03 ENCOUNTER — Other Ambulatory Visit (HOSPITAL_COMMUNITY): Payer: Self-pay | Admitting: Family Medicine

## 2022-09-03 DIAGNOSIS — Z1231 Encounter for screening mammogram for malignant neoplasm of breast: Secondary | ICD-10-CM

## 2022-09-22 ENCOUNTER — Ambulatory Visit (INDEPENDENT_AMBULATORY_CARE_PROVIDER_SITE_OTHER): Payer: Medicare Other | Admitting: Family Medicine

## 2022-09-22 VITALS — BP 170/90 | HR 57 | Temp 97.8°F | Wt 120.8 lb

## 2022-09-22 DIAGNOSIS — R030 Elevated blood-pressure reading, without diagnosis of hypertension: Secondary | ICD-10-CM | POA: Diagnosis not present

## 2022-09-22 DIAGNOSIS — F419 Anxiety disorder, unspecified: Secondary | ICD-10-CM | POA: Diagnosis not present

## 2022-09-22 NOTE — Assessment & Plan Note (Signed)
Currently improved.  We discussed possibly starting medication.  Patient declines at this time.  Will continue to monitor.  Information given regarding anxiety.

## 2022-09-22 NOTE — Assessment & Plan Note (Signed)
Advised to check her blood pressure at home and keep a close eye.

## 2022-09-22 NOTE — Progress Notes (Signed)
Subjective:  Patient ID: Debra George, female    DOB: 26-May-1941  Age: 82 y.o. MRN: [id number]  CC: Chief Complaint  Patient presents with   Anxiety    Panic attack     HPI:  82 year old female presents for evaluation of anxiety.  Patient states that she went to [facility] today and got very anxious.  She states that she felt like she was having a panic attack.  She has been quite stressed and nervous.  Her husband passed relatively recently and she is still struggling with this.  She is now feeling much better and back to her normal self.  She wanted to come in to get examined.  Blood pressure is elevated.  However, she has whitecoat syndrome.  Advised her to check her blood pressures at home.  Patient Active Problem List   Diagnosis Date Noted   Anxiety 09/22/2022   Osteoporosis 10/09/2021   Hyperlipidemia 03/25/2021   White coat syndrome without hypertension 02/04/2021   H/O adenomatous polyp of colon 10/02/2015    Social Hx   Social History   Socioeconomic History   Marital status: Married    Spouse name: Engineer, technical sales   Number of children: 1   Years of education: Not on file   Highest education level: Not on file  Occupational History   Not on file  Tobacco Use   Smoking status: Former   Smokeless tobacco: Never   Tobacco comments:    social smoker, quit 1990  Substance and Sexual Activity   Alcohol use: No    Alcohol/week: 0.0 standard drinks of alcohol   Drug use: No   Sexual activity: Not on file  Other Topics Concern   Not on file  Social History Narrative   Lives with husband Debra George.   Son also lives with patient.    Walks 2 miles per day, 5 days per week.    Social Determinants of Health   Financial Resource Strain: Low Risk  (02/11/2022)   Overall Financial Resource Strain (CARDIA)    Difficulty of Paying Living Expenses: Not very hard  Food Insecurity: No Food Insecurity (02/11/2022)   Hunger Vital Sign    Worried About Running Out of Food in the  Last Year: Never true    Ran Out of Food in the Last Year: Never true  Transportation Needs: No Transportation Needs (02/11/2022)   PRAPARE - Hydrologist (Medical): No    Lack of Transportation (Non-Medical): No  Physical Activity: Sufficiently Active (02/11/2022)   Exercise Vital Sign    Days of Exercise per Week: 5 days    Minutes of Exercise per Session: 40 min  Stress: No Stress Concern Present (02/11/2022)   [place]    Feeling of Stress : Not at all  Social Connections: [place] (02/11/2022)   Social Connection and Isolation Panel [NHANES]    Frequency of Communication with Friends and Family: More than three times a week    Frequency of Social Gatherings with Friends and Family: More than three times a week    Attends Religious Services: More than 4 times per year    Active Member of [employer] or Organizations: Yes    Attends Music therapist: More than 4 times per year    Marital Status: Married    Review of Systems Per HPI  Objective:  BP (!) 170/90   Pulse (!) 57   Temp 97.8 F (36.6  C) (Oral)   Wt 120 lb 12.8 oz (54.8 kg)   SpO2 99%   BMI 18.64 kg/m      09/22/2022    4:10 PM 07/02/2022    3:38 PM 02/13/2022    3:36 PM  BP/Weight  Systolic BP 161 096 045  Diastolic BP 90 83 83  Wt. (Lbs) 120.8 131 136.6  BMI 18.64 kg/m2 20.21 kg/m2 21.08 kg/m2    Physical Exam Vitals and nursing note reviewed.  Constitutional:      General: She is not in acute distress.    Appearance: Normal appearance.  HENT:     Head: Normocephalic and atraumatic.  Cardiovascular:     Rate and Rhythm: Normal rate and regular rhythm.  Pulmonary:     Effort: Pulmonary effort is normal.     Breath sounds: Normal breath sounds. No wheezing, rhonchi or rales.  Neurological:     Mental Status: She is alert.  Psychiatric:        Mood and Affect: Mood normal.         Behavior: Behavior normal.     Lab Results  Component Value Date   WBC 5.8 01/22/2021   HGB 13.7 01/22/2021   HCT 39.4 01/22/2021   PLT 149 (L) 01/22/2021   GLUCOSE 94 01/22/2021   CHOL 188 01/22/2021   TRIG 63 01/22/2021   HDL 68 01/22/2021   LDLCALC 108 (H) 01/22/2021   ALT 13 01/22/2021   AST 23 01/22/2021   NA 144 01/22/2021   K 4.3 01/22/2021   CL 107 (H) 01/22/2021   CREATININE 0.75 01/22/2021   BUN 12 01/22/2021   CO2 23 01/22/2021     Assessment & Plan:   Problem List Items Addressed This Visit       Cardiovascular and Mediastinum   White coat syndrome without hypertension    Advised to check her blood pressure at home and keep a close eye.        Other   Anxiety - Primary    Currently improved.  We discussed possibly starting medication.  Patient declines at this time.  Will continue to monitor.  Information given regarding anxiety.       Follow-up:  Return if symptoms worsen or fail to improve.  [place]

## 2022-10-03 ENCOUNTER — Encounter: Payer: Self-pay | Admitting: Family Medicine

## 2022-10-03 ENCOUNTER — Ambulatory Visit (INDEPENDENT_AMBULATORY_CARE_PROVIDER_SITE_OTHER): Payer: Medicare Other | Admitting: Family Medicine

## 2022-10-03 VITALS — BP 132/80 | HR 75 | Temp 97.7°F | Wt 118.2 lb

## 2022-10-03 DIAGNOSIS — L84 Corns and callosities: Secondary | ICD-10-CM | POA: Insufficient documentation

## 2022-10-03 DIAGNOSIS — F419 Anxiety disorder, unspecified: Secondary | ICD-10-CM

## 2022-10-03 MED ORDER — SERTRALINE HCL 25 MG PO TABS: 25.0000 mg | ORAL_TABLET | Freq: Every day | ORAL | 1 refills | Status: DC

## 2022-10-03 NOTE — Assessment & Plan Note (Signed)
Uncontrolled.  Starting Zoloft.

## 2022-10-03 NOTE — Assessment & Plan Note (Signed)
Advised to keep a close eye.  There is no evidence of infection.

## 2022-10-03 NOTE — Progress Notes (Signed)
Subjective:  Patient ID: Debra George, female    DOB: [birth year]  Age: 82 y.o. MRN: [id number]  CC: Chief Complaint  Patient presents with   Foot Pain    Left foot pain; between little toe and ring toe. Pt walks 2 miles a day. When pt walks it is sore. Has cotton between the toes and it helps. No swelling.    Anxiety    Requesting something mild to have on hand for anxiety. Believes it is due to husband passing in November    HPI:  82 year old female presents for evaluation the above.  Patient walks to clinic today.  She has a few concerns between the left fourth and fifth toes.  She states that she places cotton between toes to help with the discomfort.  No redness, or swelling.  Patient reports that she continues to be anxious quite often.  She attributes this to passing of her husband.  She would like to discuss options regarding treatment of anxiety today.  Patient Active Problem List   Diagnosis Date Noted   Callus between toes 10/03/2022   Anxiety 09/22/2022   Osteoporosis 10/09/2021   Hyperlipidemia 03/25/2021   White coat syndrome without hypertension 02/04/2021   H/O adenomatous polyp of colon 10/02/2015    Social Hx   Social History   Socioeconomic History   Marital status: Married    Spouse name: Engineer, technical sales   Number of children: 1   Years of education: Not on file   Highest education level: Not on file  Occupational History   Not on file  Tobacco Use   Smoking status: Former   Smokeless tobacco: Never   Tobacco comments:    social smoker, quit 1990  Substance and Sexual Activity   Alcohol use: No    Alcohol/week: 0.0 standard drinks of alcohol   Drug use: No   Sexual activity: Not on file  Other Topics Concern   Not on file  Social History Narrative   Lives with husband [place].   Son also lives with patient.    Walks 2 miles per day, 5 days per week.    Social Determinants of Health   Financial Resource Strain: Low Risk  (02/11/2022)   Overall  Financial Resource Strain (CARDIA)    Difficulty of Paying Living Expenses: Not very hard  Food Insecurity: No Food Insecurity (02/11/2022)   Hunger Vital Sign    Worried About Running Out of Food in the Last Year: Never true    Ran Out of Food in the Last Year: Never true  Transportation Needs: No Transportation Needs (02/11/2022)   PRAPARE - Hydrologist (Medical): No    Lack of Transportation (Non-Medical): No  Physical Activity: Sufficiently Active (02/11/2022)   Exercise Vital Sign    Days of Exercise per Week: 5 days    Minutes of Exercise per Session: 40 min  Stress: No Stress Concern Present (02/11/2022)   [place]    Feeling of Stress : Not at all  Social Connections: [place] (02/11/2022)   Social Connection and Isolation Panel [NHANES]    Frequency of Communication with Friends and Family: More than three times a week    Frequency of Social Gatherings with Friends and Family: More than three times a week    Attends Religious Services: More than 4 times per year    Active Member of [employer] or Organizations: Yes    Attends [employer]  or Organization Meetings: More than 4 times per year    Marital Status: Married    Review of Systems Per HPI  Objective:  BP 132/80   Pulse 75   Temp 97.7 F (36.5 C)   Wt 118 lb 3.2 oz (53.6 kg)   SpO2 100%   BMI 18.24 kg/m      10/03/2022    9:23 AM 09/22/2022    4:10 PM 07/02/2022    3:38 PM  BP/Weight  Systolic BP 025 852 778  Diastolic BP 80 90 83  Wt. (Lbs) 118.2 120.8 131  BMI 18.24 kg/m2 18.64 kg/m2 20.21 kg/m2    Physical Exam Constitutional:      General: She is not in acute distress.    Appearance: Normal appearance.  HENT:     Head: Normocephalic and atraumatic.  Cardiovascular:     Rate and Rhythm: Normal rate and regular rhythm.  Pulmonary:     Effort: Pulmonary effort is normal.     Breath sounds: Normal breath  sounds. No wheezing, rhonchi or rales.  Feet:     Comments: Callus noted on the left fourth fifth toes. Neurological:     Mental Status: She is alert.     Lab Results  Component Value Date   WBC 5.8 01/22/2021   HGB 13.7 01/22/2021   HCT 39.4 01/22/2021   PLT 149 (L) 01/22/2021   GLUCOSE 94 01/22/2021   CHOL 188 01/22/2021   TRIG 63 01/22/2021   HDL 68 01/22/2021   LDLCALC 108 (H) 01/22/2021   ALT 13 01/22/2021   AST 23 01/22/2021   NA 144 01/22/2021   K 4.3 01/22/2021   CL 107 (H) 01/22/2021   CREATININE 0.75 01/22/2021   BUN 12 01/22/2021   CO2 23 01/22/2021     Assessment & Plan:   Problem List Items Addressed This Visit       Musculoskeletal and Integument   Callus between toes    Advised to keep a close eye.  There is no evidence of infection.        Other   Anxiety - Primary    Uncontrolled.  Starting Zoloft.      Relevant Medications   sertraline (ZOLOFT) 25 MG tablet    Meds ordered this encounter  Medications   sertraline (ZOLOFT) 25 MG tablet    Sig: Take 1 tablet (25 mg total) by mouth daily.    Dispense:  90 tablet    Refill:  Uncertain

## 2022-10-03 NOTE — Patient Instructions (Signed)
Medication once daily.  Keep an eye on the callus.  Take care  Dr. Lacinda Axon

## 2022-10-10 ENCOUNTER — Encounter (HOSPITAL_COMMUNITY): Payer: Self-pay

## 2022-10-10 ENCOUNTER — Ambulatory Visit (HOSPITAL_COMMUNITY)
Admission: RE | Admit: 2022-10-10 | Discharge: 2022-10-10 | Disposition: A | Discharge status: Home / Self Care | Payer: Medicare Other | Source: Ambulatory Visit | Attending: Family Medicine | Admitting: Family Medicine

## 2022-10-10 DIAGNOSIS — Z1231 Encounter for screening mammogram for malignant neoplasm of breast: Secondary | ICD-10-CM | POA: Diagnosis present

## 2022-11-09 ENCOUNTER — Other Ambulatory Visit: Payer: Self-pay

## 2022-11-09 ENCOUNTER — Emergency Department (HOSPITAL_COMMUNITY): Payer: Medicare Other

## 2022-11-09 ENCOUNTER — Emergency Department (HOSPITAL_COMMUNITY)
Admission: EM | Admit: 2022-11-09 | Discharge: 2022-11-09 | Disposition: A | Discharge status: Home / Self Care | Payer: Medicare Other | Attending: Student | Admitting: Student

## 2022-11-09 DIAGNOSIS — R339 Retention of urine, unspecified: Secondary | ICD-10-CM | POA: Diagnosis not present

## 2022-11-09 DIAGNOSIS — R42 Dizziness and giddiness: Secondary | ICD-10-CM | POA: Insufficient documentation

## 2022-11-09 DIAGNOSIS — Z79899 Other long term (current) drug therapy: Secondary | ICD-10-CM | POA: Insufficient documentation

## 2022-11-09 DIAGNOSIS — Z87891 Personal history of nicotine dependence: Secondary | ICD-10-CM | POA: Diagnosis not present

## 2022-11-09 DIAGNOSIS — I10 Essential (primary) hypertension: Secondary | ICD-10-CM | POA: Diagnosis not present

## 2022-11-09 LAB — COMPREHENSIVE METABOLIC PANEL
ALT: 20 U/L (ref 0–44)
AST: 25 U/L (ref 15–41)
Albumin: 4 g/dL (ref 3.5–5.0)
Alkaline Phosphatase: 51 U/L (ref 38–126)
Anion gap: 9 (ref 5–15)
BUN: 12 mg/dL (ref 8–23)
CO2: 27 mmol/L (ref 22–32)
Calcium: 9.1 mg/dL (ref 8.9–10.3)
Chloride: 105 mmol/L (ref 98–111)
Creatinine, Ser: 0.6 mg/dL (ref 0.44–1.00)
GFR, Estimated: >60 mL/min (ref >60)
Glucose, Bld: 105 mg/dL — ABNORMAL HIGH (ref 70–99)
Potassium: 3.7 mmol/L (ref 3.5–5.1)
Sodium: 141 mmol/L (ref 135–145)
Total Bilirubin: 0.8 mg/dL (ref 0.3–1.2)
Total Protein: 6 g/dL — ABNORMAL LOW (ref 6.5–8.1)

## 2022-11-09 LAB — URINALYSIS, ROUTINE W REFLEX MICROSCOPIC
Bacteria, UA: NONE SEEN
Bilirubin Urine: NEGATIVE
Glucose, UA: NEGATIVE mg/dL
Ketones, ur: NEGATIVE mg/dL
Leukocytes,Ua: NEGATIVE
Nitrite: NEGATIVE
Protein, ur: NEGATIVE mg/dL
Specific Gravity, Urine: 1.003 — ABNORMAL LOW (ref 1.005–1.030)
pH: 8 (ref 5.0–8.0)

## 2022-11-09 LAB — CBC WITH DIFFERENTIAL/PLATELET
Abs Immature Granulocytes: 0.02 10*3/uL (ref 0.00–0.07)
Basophils Absolute: 0 10*3/uL (ref 0.0–0.1)
Basophils Relative: 1 %
Eosinophils Absolute: 0.1 10*3/uL (ref 0.0–0.5)
Eosinophils Relative: 1 %
HCT: 44.2 % (ref 36.0–46.0)
Hemoglobin: 15 g/dL (ref 12.0–15.0)
Immature Granulocytes: 0 %
Lymphocytes Relative: 29 %
Lymphs Abs: 1.8 10*3/uL (ref 0.7–4.0)
MCH: 31.5 pg (ref 26.0–34.0)
MCHC: 33.9 g/dL (ref 30.0–36.0)
MCV: 92.9 fL (ref 80.0–100.0)
Monocytes Absolute: 0.5 10*3/uL (ref 0.1–1.0)
Monocytes Relative: 9 %
Neutro Abs: 3.8 10*3/uL (ref 1.7–7.7)
Neutrophils Relative %: 60 %
Platelets: 167 10*3/uL (ref 150–400)
RBC: 4.76 MIL/uL (ref 3.87–5.11)
RDW: 13.7 % (ref 11.5–15.5)
WBC: 6.2 10*3/uL (ref 4.0–10.5)
nRBC: 0 % (ref 0.0–0.2)

## 2022-11-09 MED ORDER — METHYLPREDNISOLONE SODIUM SUCC 125 MG IJ SOLR
125.0000 mg | Freq: Once | INTRAMUSCULAR | Status: AC
  Administered 2022-11-09: 125 mg via INTRAVENOUS
  Filled 2022-11-09: qty 2

## 2022-11-09 MED ORDER — PROCHLORPERAZINE EDISYLATE 10 MG/2ML IJ SOLN
10.0000 mg | Freq: Once | INTRAMUSCULAR | Status: AC
  Administered 2022-11-09: 10 mg via INTRAVENOUS
  Filled 2022-11-09: qty 2

## 2022-11-09 MED ORDER — MECLIZINE HCL 12.5 MG PO TABS
12.5000 mg | ORAL_TABLET | Freq: Once | ORAL | Status: AC
  Administered 2022-11-09: 12.5 mg via ORAL
  Filled 2022-11-09: qty 1

## 2022-11-09 MED ORDER — MECLIZINE HCL 25 MG PO TABS: 25.0000 mg | ORAL_TABLET | Freq: Three times a day (TID) | ORAL | 0 refills | Status: DC | PRN

## 2022-11-09 MED ORDER — LORAZEPAM 2 MG/ML IJ SOLN
1.0000 mg | Freq: Once | INTRAMUSCULAR | Status: AC
  Administered 2022-11-09: 1 mg via INTRAVENOUS
  Filled 2022-11-09: qty 1

## 2022-11-09 MED ORDER — LACTATED RINGERS IV BOLUS
1000.0000 mL | Freq: Once | INTRAVENOUS | Status: AC
  Administered 2022-11-09: 1000 mL via INTRAVENOUS

## 2022-11-09 MED ORDER — DIPHENHYDRAMINE HCL 50 MG/ML IJ SOLN
25.0000 mg | Freq: Once | INTRAMUSCULAR | Status: AC
  Administered 2022-11-09: 25 mg via INTRAVENOUS
  Filled 2022-11-09: qty 1

## 2022-11-09 NOTE — ED Provider Notes (Signed)
[facility] Provider Note  CSN: [id number] Arrival date & time: 11/09/22 0335  Chief Complaint(s) Dizziness  HPI [place] is a 82 y.o. female with PMH whitecoat hypertension, osteoporosis, anxiety, macular degeneration who presents emergency room for evaluation of vertigo.  Patient states that she woke up at approximately 2 AM this morning and attempted to get out of bed to use the bathroom and had sudden onset vertigo.  She states that the vertigo stops when she stays still but worsens when she lies flat.  Denies numbness, tingling, weakness, nausea, vomiting, headache, fever or other systemic or neurologic complaints.   Past Medical History Past Medical History:  Diagnosis Date   Macular degeneration    Patient Active Problem List   Diagnosis Date Noted   Callus between toes 10/03/2022   Anxiety 09/22/2022   Osteoporosis 10/09/2021   Hyperlipidemia 03/25/2021   White coat syndrome without hypertension 02/04/2021   H/O adenomatous polyp of colon 10/02/2015   Home Medication(s) Prior to Admission medications   Medication Sig Start Date End Date Taking? Authorizing Provider  calcium-vitamin D (OSCAL WITH D) 500-200 MG-UNIT per tablet Take 1 tablet by mouth 2 (two) times daily.    [provider]  Multiple Vitamins-Minerals (PRESERVISION AREDS 2 PO) Take 1 tablet by mouth 2 (two) times daily.    [provider]  sertraline (ZOLOFT) 25 MG tablet Take 1 tablet (25 mg total) by mouth daily. 10/03/22   Coral Spikes, DO                                                                                                                                    Past Surgical History Past Surgical History:  Procedure Laterality Date   APPENDECTOMY     CATARACT EXTRACTION W/PHACO Left 11/27/2015   Procedure: CATARACT EXTRACTION PHACO AND INTRAOCULAR LENS PLACEMENT (IOC);  Surgeon: Rutherford Guys, MD;  Location: [facility];  Service:  Ophthalmology;  Laterality: Left;  CDE:10.0   CATARACT EXTRACTION W/PHACO Right 12/11/2015   Procedure: CATARACT EXTRACTION PHACO AND INTRAOCULAR LENS PLACEMENT (IOC);  Surgeon: Rutherford Guys, MD;  Location: [facility];  Service: Ophthalmology;  Laterality: Right;  CDE:8.61   CESAREAN SECTION     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   COLONOSCOPY  09/03/2005   RMR: normal rectum/dimintive polyp at the hepatic flexure cold bx   COLONOSCOPY  2012   adenomatous appearing anastomosis but unremarkable biopsy   COLONOSCOPY N/A 10/11/2015   Procedure: COLONOSCOPY;  Surgeon: Daneil Dolin, MD;  Location: [facility];  Service: Endoscopy;  Laterality: N/A;  200   COLONOSCOPY N/A 06/15/2019   Procedure: COLONOSCOPY;  Surgeon: Daneil Dolin, MD;  Location: [facility];  Service: Endoscopy;  Laterality: N/A;  8:45   POLYPECTOMY  06/15/2019   Procedure: POLYPECTOMY;  Surgeon: Daneil Dolin, MD;  Location: [facility];  Service:  Endoscopy;;  splenic flexure   TUBAL LIGATION     Family History Family History  Problem Relation Age of Onset   Congestive Heart Failure Mother    Heart Problems Father    COPD Brother    Colon cancer Neg Hx    Breast cancer Neg Hx     Social History Social History   Tobacco Use   Smoking status: Former   Smokeless tobacco: Never   Tobacco comments:    social smoker, quit 1990  Substance Use Topics   Alcohol use: No    Alcohol/week: 0.0 standard drinks of alcohol   Drug use: No   Allergies Pneumococcal polysaccharide vaccine  Review of Systems Review of Systems  Neurological:  Positive for dizziness.    Physical Exam Vital Signs  I have reviewed the triage vital signs BP (!) 178/104   Pulse 91   Temp 97.8 F (36.6 C) (Oral)   Resp 16   Ht '5\' 8"'$  (1.727 m)   Wt 54.4 kg   SpO2 100%   BMI 18.25 kg/m   Physical Exam Vitals and nursing note reviewed.  Constitutional:      General: She is not in acute distress.    Appearance: She is  well-developed.  HENT:     Head: Normocephalic and atraumatic.  Eyes:     Conjunctiva/sclera: Conjunctivae normal.  Cardiovascular:     Rate and Rhythm: Normal rate and regular rhythm.     Heart sounds: No murmur heard. Pulmonary:     Effort: Pulmonary effort is normal. No respiratory distress.     Breath sounds: Normal breath sounds.  Abdominal:     Palpations: Abdomen is soft.     Tenderness: There is no abdominal tenderness.  Musculoskeletal:        General: No swelling.     Cervical back: Neck supple.  Skin:    General: Skin is warm and dry.     Capillary Refill: Capillary refill takes less than 2 seconds.  Neurological:     Mental Status: She is alert.     Cranial Nerves: No cranial nerve deficit.     Sensory: No sensory deficit.     Motor: No weakness.  Psychiatric:        Mood and Affect: Mood normal.     ED Results and Treatments Labs (all labs ordered are listed, but only abnormal results are displayed) Labs Reviewed  COMPREHENSIVE METABOLIC PANEL - Abnormal; Notable for the following components:      Result Value   Glucose, Bld 105 (*)    Total Protein 6.0 (*)    All other components within normal limits  CBC WITH DIFFERENTIAL/PLATELET                                                                                                                          Radiology CT Head Wo Contrast  Result Date: 11/09/2022 CLINICAL DATA:  Dizziness. EXAM: CT HEAD WITHOUT CONTRAST TECHNIQUE: Contiguous axial  images were obtained from the base of the skull through the vertex without intravenous contrast. RADIATION DOSE REDUCTION: This exam was performed according to the departmental dose-optimization program which includes automated exposure control, adjustment of the mA and/or kV according to patient size and/or use of iterative reconstruction technique. COMPARISON:  None Available. FINDINGS: Brain: There is mild cerebral atrophy with widening of the extra-axial spaces and  ventricular dilatation. There are areas of decreased attenuation within the white matter tracts of the supratentorial brain, consistent with microvascular disease changes. Vascular: There is marked severity calcification of the bilateral cavernous carotid arteries. Skull: Normal. Negative for fracture or focal lesion. Sinuses/Orbits: No acute finding. Other: None. IMPRESSION: 1. No acute intracranial abnormality. 2. Generalized cerebral atrophy and microvascular disease changes of the supratentorial brain. Electronically Signed   By: Virgina Norfolk M.D.   On: 11/09/2022 04:21    Pertinent labs & imaging results that were available during my care of the patient were reviewed by me and considered in my medical decision making (see MDM for details).  Medications Ordered in ED Medications  LORazepam (ATIVAN) injection 1 mg (has no administration in time range)  lactated ringers bolus 1,000 mL (1,000 mLs Intravenous New Bag/Given 11/09/22 0456)  meclizine (ANTIVERT) tablet 12.5 mg (12.5 mg Oral Given 11/09/22 0456)  prochlorperazine (COMPAZINE) injection 10 mg (10 mg Intravenous Given 11/09/22 0551)  diphenhydrAMINE (BENADRYL) injection 25 mg (25 mg Intravenous Given 11/09/22 0548)                                                                                                                                     Procedures Procedures  (including critical care time)  Medical Decision Making / ED Course   This patient presents to the ED for concern of vertigo, this involves an extensive number of treatment options, and is a complaint that carries with it a high risk of complications and morbidity.  The differential diagnosis includes BPPV, CVA, electrolyte abnormality, labyrinthitis/vestibular neuritis, Meniere's disease  MDM: Patient seen emerged part for evaluation of vertigo.  Physical exam largely unremarkable with no focal motor or sensory deficits.  Horizontal nystagmus with rapid head movement  but it is fatigable.  Laboratory evaluation largely unremarkable.  CT head unremarkable.  Patient received meclizine with no improvement and thus received Compazine, Benadryl and fluids.  On my reevaluation, patient still with no improvement and she is now having urinary retention, likely secondary to antihistamines.  At time of signout, patient pending Ativan trial.  Please see provider signout for continuation of workup.  May consider brain MRI if dizziness is persistent.   Additional history obtained: -Additional history obtained from son -External records from outside source obtained and reviewed including: Chart review including previous notes, labs, imaging, consultation notes   Lab Tests: -I ordered, reviewed, and interpreted labs.   The pertinent results include:   Labs Reviewed  COMPREHENSIVE METABOLIC PANEL - Abnormal; Notable  for the following components:      Result Value   Glucose, Bld 105 (*)    Total Protein 6.0 (*)    All other components within normal limits  CBC WITH DIFFERENTIAL/PLATELET    Imaging Studies ordered: I ordered imaging studies including CT head I independently visualized and interpreted imaging. I agree with the radiologist interpretation   Medicines ordered and prescription drug management: Meds ordered this encounter  Medications   lactated ringers bolus 1,000 mL   meclizine (ANTIVERT) tablet 12.5 mg   prochlorperazine (COMPAZINE) injection 10 mg   diphenhydrAMINE (BENADRYL) injection 25 mg   LORazepam (ATIVAN) injection 1 mg    -I have reviewed the patients home medicines and have made adjustments as needed  Critical interventions none  Cardiac Monitoring: The patient was maintained on a cardiac monitor.  I personally viewed and interpreted the cardiac monitored which showed an underlying rhythm of: NSR  Social Determinants of Health:  Factors impacting patients care include: none   Reevaluation: After the interventions noted above, I  reevaluated the patient and found that they have :stayed the same  Co morbidities that complicate the patient evaluation  Past Medical History:  Diagnosis Date   Macular degeneration       Dispostion: I considered admission for this patient, and disposition pending reevaluation by oncoming provider.  Please see provider signout for continuation of workup     Final Clinical Impression(s) / ED Diagnoses Final diagnoses:  None     '@PCDICTATION'$ @    Teressa Lower, MD 11/09/22 [phone]

## 2022-11-09 NOTE — Discharge Instructions (Signed)
Follow-up with your family doctor this week for recheck 

## 2022-11-09 NOTE — ED Triage Notes (Signed)
Pt c/o dizziness, reports she woke up around 0200 to go to the bathroom and started feeling dizzy and nauseous. States it gets worse when she lays down.

## 2022-11-09 NOTE — ED Notes (Signed)
Resting at this time

## 2022-11-10 ENCOUNTER — Telehealth: Payer: Self-pay

## 2022-11-10 NOTE — Transitions of Care (Post Inpatient/ED Visit) (Unsigned)
   11/10/2022  Name: Debra George MRN: [id number] DOB: 1941/09/15  Today's TOC FU Call Status: Today's TOC FU Call Status:: Unsuccessul Call (1st Attempt) Unsuccessful Call (1st Attempt) Date: 11/10/22  Attempted to reach the patient regarding the most recent Inpatient/ED visit.  Follow Up Plan: Additional outreach attempts will be made to reach the patient to complete the Transitions of Care (Post Inpatient/ED visit) call.   Signature Juanda Crumble, [place] Direct Dial [phone]

## 2022-11-11 LAB — URINE CULTURE: Culture: NO GROWTH

## 2022-11-12 NOTE — Transitions of Care (Post Inpatient/ED Visit) (Signed)
   11/12/2022  Name: Debra George MRN: [id number] DOB: 02/08/1941  Today's TOC FU Call Status: Today's TOC FU Call Status:: Successful TOC FU Call Competed Unsuccessful Call (1st Attempt) Date: 11/10/22 [facility] FU Call Complete Date: 11/12/22  Transition Care Management Follow-up Telephone Call Date of Discharge: 11/09/22 [facility] Type of Discharge: Emergency Department Reason for ED Visit: Other: (dizziness) How have you been since you were released from the hospital?: Better Any questions or concerns?: No  Items Reviewed: Did you receive and understand the discharge instructions provided?: Yes Medications obtained and verified?: Yes (Medications Reviewed) Any new allergies since your discharge?: No Dietary orders reviewed?: NA Do you have support at home?: Yes People in Home: child(ren), adult  Home Care and Equipment/Supplies: [place] Ordered?: NA Any new equipment or medical supplies ordered?: NA  Functional Questionnaire: Do you need assistance with bathing/showering or dressing?: No Do you need assistance with meal preparation?: No Do you need assistance with eating?: No Do you have difficulty maintaining continence: No Do you need assistance with getting out of bed/getting out of a chair/moving?: No Do you have difficulty managing or taking your medications?: No  Folllow up appointments reviewed: PCP Follow-up appointment confirmed?: Yes Date of PCP follow-up appointment?: 11/17/22 Follow-up Provider: [facility] Follow-up appointment confirmed?: NA Do you need transportation to your follow-up appointment?: No Do you understand care options if your condition(s) worsen?: Yes-patient verbalized understanding    [place], [place] Nurse Health Advisor Direct Dial [phone]

## 2022-11-17 ENCOUNTER — Encounter: Payer: Self-pay | Admitting: Family Medicine

## 2022-11-17 ENCOUNTER — Ambulatory Visit (INDEPENDENT_AMBULATORY_CARE_PROVIDER_SITE_OTHER): Payer: Medicare Other | Admitting: Family Medicine

## 2022-11-17 VITALS — BP 175/84 | HR 100 | Ht 68.0 in | Wt 118.0 lb

## 2022-11-17 DIAGNOSIS — I10 Essential (primary) hypertension: Secondary | ICD-10-CM | POA: Diagnosis not present

## 2022-11-17 MED ORDER — AMLODIPINE BESYLATE 2.5 MG PO TABS: 2.5000 mg | ORAL_TABLET | Freq: Every day | ORAL | 0 refills | Status: DC

## 2022-11-17 NOTE — Assessment & Plan Note (Signed)
Trial of low-dose amlodipine.

## 2022-11-17 NOTE — Patient Instructions (Signed)
Keep a close eye on your BP.  Medication as directed.  Follow up in 3 months.

## 2022-11-17 NOTE — Progress Notes (Signed)
Subjective:  Patient ID: Debra George, female    DOB: [birth year]  Age: 82 y.o. MRN: [id number]  CC: Chief Complaint  Patient presents with   ER follow up     Inner ear dizziness 2/25was given meclizine and sertraline -stopped both   Hypertension    Has home readings wit her today    HPI:  82 year old female presents for follow-up.  Patient recently seen in the ER for vertigo.  This has now resolved.  She has had no more bouts of dizziness.  Patient continues to have difficulty with labile hypertension particularly due to the fact that she has whitecoat hypertension.  BP markedly elevated here today.  Patient brings in her home blood pressure readings.  Several of her home readings are in the [id number] systolic.  We will discuss this today.   Patient Active Problem List   Diagnosis Date Noted   Callus between toes 10/03/2022   Anxiety 09/22/2022   Osteoporosis 10/09/2021   Hyperlipidemia 03/25/2021   White coat syndrome with hypertension 02/04/2021   H/O adenomatous polyp of colon 10/02/2015    Social Hx   Social History   Socioeconomic History   Marital status: Married    Spouse name: Engineer, technical sales   Number of children: 1   Years of education: Not on file   Highest education level: Not on file  Occupational History   Not on file  Tobacco Use   Smoking status: Former   Smokeless tobacco: Never   Tobacco comments:    social smoker, quit 1990  Substance and Sexual Activity   Alcohol use: No    Alcohol/week: 0.0 standard drinks of alcohol   Drug use: No   Sexual activity: Not on file  Other Topics Concern   Not on file  Social History Narrative   Lives with husband [place].   Son also lives with patient.    Walks 2 miles per day, 5 days per week.    Social Determinants of Health   Financial Resource Strain: Low Risk  (02/11/2022)   Overall Financial Resource Strain (CARDIA)    Difficulty of Paying Living Expenses: Not very hard  Food Insecurity: No Food Insecurity  (02/11/2022)   Hunger Vital Sign    Worried About Running Out of Food in the Last Year: Never true    Ran Out of Food in the Last Year: Never true  Transportation Needs: No Transportation Needs (02/11/2022)   PRAPARE - Hydrologist (Medical): No    Lack of Transportation (Non-Medical): No  Physical Activity: Sufficiently Active (02/11/2022)   Exercise Vital Sign    Days of Exercise per Week: 5 days    Minutes of Exercise per Session: 40 min  Stress: No Stress Concern Present (02/11/2022)   [place]    Feeling of Stress : Not at all  Social Connections: [place] (02/11/2022)   Social Connection and Isolation Panel [NHANES]    Frequency of Communication with Friends and Family: More than three times a week    Frequency of Social Gatherings with Friends and Family: More than three times a week    Attends Religious Services: More than 4 times per year    Active Member of [employer] or Organizations: Yes    Attends Music therapist: More than 4 times per year    Marital Status: Married    Review of Systems  Constitutional: Negative.   Neurological:  Negative  for dizziness.  Psychiatric/Behavioral:  The patient is nervous/anxious.    Objective:  BP (!) 175/84   Pulse 100   Ht '5\' 8"'$  (1.727 m)   Wt 118 lb (53.5 kg)   SpO2 98%   BMI 17.94 kg/m      11/17/2022   11:37 AM 11/17/2022   11:09 AM 11/09/2022    9:55 AM  BP/Weight  Systolic BP 0000000 [id number]  Diastolic BP 84 90 86  Wt. (Lbs)  118   BMI  17.94 kg/m2     Physical Exam Vitals and nursing note reviewed.  Constitutional:      General: She is not in acute distress.    Appearance: Normal appearance.  HENT:     Head: Normocephalic and atraumatic.  Eyes:     General:        Right eye: No discharge.        Left eye: No discharge.     Conjunctiva/sclera: Conjunctivae normal.  Cardiovascular:     Rate and Rhythm:  Regular rhythm. Tachycardia present.  Pulmonary:     Effort: Pulmonary effort is normal.     Breath sounds: Normal breath sounds. No wheezing, rhonchi or rales.  Neurological:     Mental Status: She is alert.     Lab Results  Component Value Date   WBC 6.2 11/09/2022   HGB 15.0 11/09/2022   HCT 44.2 11/09/2022   PLT 167 11/09/2022   GLUCOSE 105 (H) 11/09/2022   CHOL 188 01/22/2021   TRIG 63 01/22/2021   HDL 68 01/22/2021   LDLCALC 108 (H) 01/22/2021   ALT 20 11/09/2022   AST 25 11/09/2022   NA 141 11/09/2022   K 3.7 11/09/2022   CL 105 11/09/2022   CREATININE 0.60 11/09/2022   BUN 12 11/09/2022   CO2 27 11/09/2022     Assessment & Plan:   Problem List Items Addressed This Visit       Cardiovascular and Mediastinum   White coat syndrome with hypertension - Primary    Trial of low-dose amlodipine.      Relevant Medications   amLODipine (NORVASC) 2.5 MG tablet    Meds ordered this encounter  Medications   amLODipine (NORVASC) 2.5 MG tablet    Sig: Take 1 tablet (2.5 mg total) by mouth daily.    Dispense:  90 tablet    Refill:  0    Follow-up:  Return in about 3 months (around 02/17/2023).  [place]

## 2022-12-26 ENCOUNTER — Telehealth: Payer: Self-pay | Admitting: Family Medicine

## 2022-12-26 NOTE — Telephone Encounter (Signed)
Patient states having reaction to new blood pressure amLODipine (NORVASC) 2.5 MG table  causing her to have swelling in her feet,legs and ankles . She states rash came up on her face. [employer]

## 2022-12-26 NOTE — Telephone Encounter (Signed)
Spoke with patient and she stated she had mild ankle and leg swelling present, the rash was mild located across her face and only present last night, no longer there today. She has an appt on Monday.

## 2022-12-29 ENCOUNTER — Encounter: Payer: Self-pay | Admitting: Family Medicine

## 2022-12-29 ENCOUNTER — Ambulatory Visit (INDEPENDENT_AMBULATORY_CARE_PROVIDER_SITE_OTHER): Payer: Medicare Other | Admitting: Family Medicine

## 2022-12-29 VITALS — BP 134/86 | HR 94 | Temp 97.9°F | Ht 68.0 in | Wt 116.0 lb

## 2022-12-29 DIAGNOSIS — I10 Essential (primary) hypertension: Secondary | ICD-10-CM

## 2022-12-29 NOTE — Progress Notes (Signed)
Subjective:  Patient ID: Debra George, female    DOB: Mar 16, 1941  Age: 82 y.o. MRN: [id number]  CC: Chief Complaint  Patient presents with   Hypertension    Stared having a rash on face, leg and ankle swelling. Stopped taking medication on Friday - swelling decreased    HPI:  82 year old female presents for evaluation the above.  Patient recently suffered a facial rash and lower extremity edema.  She messaged her office and amlodipine was stopped.  Her symptoms have now resolved.  Blood pressure is well-controlled today.  No current swelling.  No current rash.  No other complaints or concerns at this time.  Patient Active Problem List   Diagnosis Date Noted   Callus between toes 10/03/2022   Anxiety 09/22/2022   Osteoporosis 10/09/2021   Hyperlipidemia 03/25/2021   White coat syndrome with hypertension 02/04/2021   H/O adenomatous polyp of colon 10/02/2015    Social Hx   Social History   Socioeconomic History   Marital status: Married    Spouse name: Optometrist   Number of children: 1   Years of education: Not on file   Highest education level: Not on file  Occupational History   Not on file  Tobacco Use   Smoking status: Former   Smokeless tobacco: Never   Tobacco comments:    social smoker, quit 1990  Substance and Sexual Activity   Alcohol use: No    Alcohol/week: 0.0 standard drinks of alcohol   Drug use: No   Sexual activity: Not on file  Other Topics Concern   Not on file  Social History Narrative   Lives with husband [place].   Son also lives with patient.    Walks 2 miles per day, 5 days per week.    Social Determinants of Health   Financial Resource Strain: Low Risk  (02/11/2022)   Overall Financial Resource Strain (CARDIA)    Difficulty of Paying Living Expenses: Not very hard  Food Insecurity: No Food Insecurity (02/11/2022)   Hunger Vital Sign    Worried About Running Out of Food in the Last Year: Never true    Ran Out of Food in the Last Year:  Never true  Transportation Needs: No Transportation Needs (02/11/2022)   PRAPARE - Administrator, Civil Service (Medical): No    Lack of Transportation (Non-Medical): No  Physical Activity: Sufficiently Active (02/11/2022)   Exercise Vital Sign    Days of Exercise per Week: 5 days    Minutes of Exercise per Session: 40 min  Stress: No Stress Concern Present (02/11/2022)   [name] - Occupational Stress Questionnaire    Feeling of Stress : Not at all  Social Connections: Socially Integrated (02/11/2022)   Social Connection and Isolation Panel [NHANES]    Frequency of Communication with Friends and Family: More than three times a week    Frequency of Social Gatherings with Friends and Family: More than three times a week    Attends Religious Services: More than 4 times per year    Active Member of [employer] or Organizations: Yes    Attends Engineer, structural: More than 4 times per year    Marital Status: Married    Review of Systems Per HPI  Objective:  BP 134/86   Pulse 94   Temp 97.9 F (36.6 C)   Ht 5\' 8"  (1.727 m)   Wt 116 lb (52.6 kg)   SpO2 98%   BMI  17.64 kg/m      12/29/2022   10:45 AM 11/17/2022   11:37 AM 11/17/2022   11:09 AM  BP/Weight  Systolic BP 134 175 180  Diastolic BP 86 84 90  Wt. (Lbs) 116  118  BMI 17.64 kg/m2  17.94 kg/m2    Physical Exam Vitals and nursing note reviewed.  Constitutional:      General: She is not in acute distress.    Appearance: Normal appearance.  HENT:     Head: Normocephalic and atraumatic.  Cardiovascular:     Rate and Rhythm: Normal rate and regular rhythm.  Pulmonary:     Effort: Pulmonary effort is normal.     Breath sounds: Normal breath sounds. No wheezing, rhonchi or rales.  Abdominal:     General: There is no distension.     Palpations: Abdomen is soft.     Tenderness: There is no abdominal tenderness.  Musculoskeletal:     Right lower leg: No edema.     Left  lower leg: No edema.  Skin:    Findings: No rash.  Neurological:     Mental Status: She is alert.  Psychiatric:        Mood and Affect: Mood normal.        Behavior: Behavior normal.     Lab Results  Component Value Date   WBC 6.2 11/09/2022   HGB 15.0 11/09/2022   HCT 44.2 11/09/2022   PLT 167 11/09/2022   GLUCOSE 105 (H) 11/09/2022   CHOL 188 01/22/2021   TRIG 63 01/22/2021   HDL 68 01/22/2021   LDLCALC 108 (H) 01/22/2021   ALT 20 11/09/2022   AST 25 11/09/2022   NA 141 11/09/2022   K 3.7 11/09/2022   CL 105 11/09/2022   CREATININE 0.60 11/09/2022   BUN 12 11/09/2022   CO2 27 11/09/2022     Assessment & Plan:   Problem List Items Addressed This Visit       Cardiovascular and Mediastinum   White coat syndrome with hypertension - Primary    Patient suffered side effects from amlodipine.  This was stopped and she is now back to normal.  Will continue to monitor her blood pressure closely.  Supportive care.       Follow-up:  Return in about 6 months (around 06/30/2023).  Everlene Other DO [facility]

## 2022-12-29 NOTE — Patient Instructions (Signed)
Keep an eye on your blood pressure.  Follow up in 6 months.  Take care  Dr. Adriana Simas

## 2022-12-29 NOTE — Assessment & Plan Note (Signed)
Patient suffered side effects from amlodipine.  This was stopped and she is now back to normal.  Will continue to monitor her blood pressure closely.  Supportive care.

## 2023-02-17 ENCOUNTER — Ambulatory Visit: Payer: Medicare Other | Admitting: Family Medicine

## 2023-02-20 ENCOUNTER — Ambulatory Visit (INDEPENDENT_AMBULATORY_CARE_PROVIDER_SITE_OTHER): Payer: Medicare Other

## 2023-02-20 VITALS — BP 127/67 | HR 89 | Ht 68.0 in | Wt 116.0 lb

## 2023-02-20 DIAGNOSIS — Z0000 Encounter for general adult medical examination without abnormal findings: Secondary | ICD-10-CM

## 2023-02-20 NOTE — Progress Notes (Signed)
 I connected with  Debra George on 02/20/23 by a audio enabled telemedicine application and verified that I am speaking with the correct person using two identifiers.  Patient Location: Home  Provider Location: Home Office  I discussed the limitations of evaluation and management by telemedicine. The patient expressed understanding and agreed to proceed.  Subjective:   Debra George is a 82 y.o. female who presents for Medicare Annual (Subsequent) preventive examination.  Review of Systems     Cardiac Risk Factors include: advanced age (>36men, >85 women)     Objective:    Today's Vitals   02/20/23 1025  BP: 127/67  Pulse: 89  Weight: 116 lb (52.6 kg)  Height: 5\' 8"  (1.727 m)   Body mass index is 17.64 kg/m.     02/20/2023   10:30 AM 11/09/2022    7:31 AM 02/11/2022   10:23 AM 06/15/2019    6:37 AM 12/11/2015    6:42 AM 11/20/2015    1:55 PM 10/11/2015   12:55 PM  Advanced Directives  Does Patient Have a Medical Advance Directive? No No Yes No No No Yes  Type of Surveyor, minerals;Living will    Living will  Copy of Healthcare Power of Attorney in Chart?   No - copy requested    No - copy requested  Would patient like information on creating a medical advance directive? No - Patient declined No - Patient declined  No - Patient declined No - patient declined information No - patient declined information     Current Medications (verified) Outpatient Encounter Medications as of 02/20/2023  Medication Sig   calcium-vitamin D (OSCAL WITH D) 500-200 MG-UNIT per tablet Take 1 tablet by mouth 2 (two) times daily.   Multiple Vitamins-Minerals (PRESERVISION AREDS 2 PO) Take 1 tablet by mouth 2 (two) times daily.   sertraline (ZOLOFT) 25 MG tablet Take 1 tablet (25 mg total) by mouth daily. (Patient not taking: Reported on 11/17/2022)   No facility-administered encounter medications on file as of 02/20/2023.    Allergies  (verified) Pneumococcal polysaccharide vaccine   History: Past Medical History:  Diagnosis Date   Macular degeneration    Past Surgical History:  Procedure Laterality Date   APPENDECTOMY     CATARACT EXTRACTION W/PHACO Left 11/27/2015   Procedure: CATARACT EXTRACTION PHACO AND INTRAOCULAR LENS PLACEMENT (IOC);  Surgeon: Jethro Bolus, MD;  Location: [facility];  Service: Ophthalmology;  Laterality: Left;  CDE:10.0   CATARACT EXTRACTION W/PHACO Right 12/11/2015   Procedure: CATARACT EXTRACTION PHACO AND INTRAOCULAR LENS PLACEMENT (IOC);  Surgeon: Jethro Bolus, MD;  Location: [facility];  Service: Ophthalmology;  Laterality: Right;  CDE:8.61   CESAREAN SECTION     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   COLONOSCOPY  09/03/2005   RMR: normal rectum/dimintive polyp at the hepatic flexure cold bx   COLONOSCOPY  2012   adenomatous appearing anastomosis but unremarkable biopsy   COLONOSCOPY N/A 10/11/2015   Procedure: COLONOSCOPY;  Surgeon: Corbin Ade, MD;  Location: [facility];  Service: Endoscopy;  Laterality: N/A;  200   COLONOSCOPY N/A 06/15/2019   Procedure: COLONOSCOPY;  Surgeon: Corbin Ade, MD;  Location: [facility];  Service: Endoscopy;  Laterality: N/A;  8:45   POLYPECTOMY  06/15/2019   Procedure: POLYPECTOMY;  Surgeon: Corbin Ade, MD;  Location: [facility];  Service: Endoscopy;;  splenic flexure   TUBAL LIGATION     Family History  Problem Relation Age of Onset   Congestive Heart Failure Mother    Heart Problems Father    COPD Brother    Colon cancer Neg Hx    Breast cancer Neg Hx    Social History   Socioeconomic History   Marital status: Married    Spouse name: Optometrist   Number of children: 1   Years of education: Not on file   Highest education level: Not on file  Occupational History   Not on file  Tobacco Use   Smoking status: Former   Smokeless tobacco: Never   Tobacco comments:    social smoker, quit 1990  Substance and Sexual Activity   Alcohol  use: No    Alcohol/week: 0.0 standard drinks of alcohol   Drug use: No   Sexual activity: Not on file  Other Topics Concern   Not on file  Social History Narrative   Lives with husband [name].   Son also lives with patient.    Walks 2 miles per day, 5 days per week.       03-08-2023 Husband passed away in 2022-08-25  Social Determinants of Health   Financial Resource Strain: Low Risk  (03/08/23)   Overall Financial Resource Strain (CARDIA)    Difficulty of Paying Living Expenses: Not hard at all  Food Insecurity: No Food Insecurity (March 08, 2023)   Hunger Vital Sign    Worried About Running Out of Food in the Last Year: Never true    Ran Out of Food in the Last Year: Never true  Transportation Needs: No Transportation Needs (Mar 08, 2023)   PRAPARE - Administrator, Civil Service (Medical): No    Lack of Transportation (Non-Medical): No  Physical Activity: Sufficiently Active (08-Mar-2023)   Exercise Vital Sign    Days of Exercise per Week: 7 days    Minutes of Exercise per Session: 30 min  Stress: No Stress Concern Present (03/08/23)   [name] - Occupational Stress Questionnaire    Feeling of Stress : Not at all  Social Connections: Socially Integrated (08-Mar-2023)   Social Connection and Isolation Panel [NHANES]    Frequency of Communication with Friends and Family: More than three times a week    Frequency of Social Gatherings with Friends and Family: More than three times a week    Attends Religious Services: More than 4 times per year    Active Member of [employer] or Organizations: Yes    Attends Engineer, structural: More than 4 times per year    Marital Status: Married    Tobacco Counseling Counseling given: Yes Tobacco comments: social smoker, quit 1990   Clinical Intake:  Pre-visit preparation completed: Yes  Pain : No/denies pain     BMI - recorded: 17.64 Nutritional Status: BMI <19  Underweight Nutritional Risks:  None Diabetes: No  How often do you need to have someone help you when you read instructions, pamphlets, or other written materials from your doctor or pharmacy?: 1 - Never  Diabetic?no  Interpreter Needed?: No  Information entered by ::  Erla Bacchi, CMA   Activities of Daily Living    08-Mar-2023   10:31 AM  In your present state of health, do you have any difficulty performing the following activities:  Hearing? 0  Vision? 0  Difficulty concentrating or making decisions? 0  Walking or climbing stairs? 0  Dressing or bathing? 0  Doing errands, shopping? 0  Preparing Food and eating ? N  Using the Toilet? N  In the past six months, have you accidently leaked urine? N  Do you have problems with loss of bowel control? N  Managing your Medications? N  Managing your Finances? N  Housekeeping or managing your Housekeeping? N    Patient Care Team: Tommie Sams, DO as PCP - General (Family Medicine) Jena Gauss Gerrit Friends, MD as Consulting Physician (Gastroenterology) Janalyn Harder, MD (Inactive) as Consulting Physician (Dermatology)  Indicate any recent Medical Services you may have received from other than [facility] providers in the past year (date may be approximate).     Assessment:   This is a routine wellness examination for [place].  Hearing/Vision screen Hearing Screening - Comments:: Patient denies any hearing difficulties.   Vision Screening - Comments:: Wears rx glasses - up to date with routine eye exams with  Dr.Groat  Dietary issues and exercise activities discussed: Current Exercise Habits: Home exercise routine, Type of exercise: walking, Time (Minutes): 45, Frequency (Times/Week): 5, Weekly Exercise (Minutes/Week): 225, Exercise limited by: None identified   Goals Addressed             This Visit's Progress    Patient Stated       Patient states her goal is to remain as active and independent as she currently is.        Depression Screen    02/20/2023    10:28 AM 12/29/2022   10:59 AM 11/17/2022   11:24 AM 10/03/2022    9:20 AM 09/22/2022    4:17 PM 02/11/2022   10:18 AM 07/24/2021   10:54 AM  PHQ 2/9 Scores  PHQ - 2 Score 0 0 0 0 0 0 0  PHQ- 9 Score 0 0 0  0      Fall Risk    02/20/2023   10:30 AM 10/03/2022    9:20 AM 02/11/2022   10:25 AM 07/24/2021   10:53 AM 02/04/2021   10:23 AM  Fall Risk   Falls in the past year? 0 0 0 0 0  Number falls in past yr: 0 0 0 0 0  Injury with Fall? 0 0 0 0 0  Risk for fall due to : No Fall Risks No Fall Risks No Fall Risks No Fall Risks   Follow up Falls prevention discussed Falls evaluation completed Falls prevention discussed Falls evaluation completed Falls evaluation completed    FALL RISK PREVENTION PERTAINING TO THE HOME:  Any stairs in or around the home? No  If so, are there any without handrails? No  Home free of loose throw rugs in walkways, pet beds, electrical cords, etc? Yes  Adequate lighting in your home to reduce risk of falls? Yes   ASSISTIVE DEVICES UTILIZED TO PREVENT FALLS:  Life alert? No  Use of a cane, walker or w/c? No  Grab bars in the bathroom? No  Shower chair or bench in shower? No  Elevated toilet seat or a handicapped toilet? No   TIMED UP AND GO:  Was the test performed? No .  Cognitive Function:        02/20/2023   10:31 AM 02/11/2022   10:29 AM  6CIT Screen  What Year? 0 points 0 points  What month? 0 points 0 points  What time? 0 points 0 points  Count back from 20 0 points 0 points  Months in reverse 0 points 0 points  Repeat phrase 0 points 0 points  Total Score 0 points 0 points    Immunizations Immunization  History  Administered Date(s) Administered   Fluad Quad(high Dose 65+) 06/11/2022   Influenza,inj,Quad PF,6+ Mos 06/27/2015, 07/14/2016, 06/30/2017, 07/01/2018, 06/18/2019   Influenza-Unspecified 07/16/2013, 06/18/2020, 06/24/2021   Moderna SARS-COV2 Booster Vaccination 10/17/2020, 02/06/2022   Moderna Sars-Covid-2 Vaccination  10/25/2019, 07/17/2020, 07/18/2021   Pneumococcal Conjugate-13 09/05/2014   Pneumococcal Polysaccharide-23 09/21/2018    TDAP status: Due, Education has been provided regarding the importance of this vaccine. Advised may receive this vaccine at local pharmacy or Health Dept. Aware to provide a copy of the vaccination record if obtained from local pharmacy or Health Dept. Verbalized acceptance and understanding.  Flu Vaccine status: Up to date  Pneumococcal vaccine status: Up to date  Covid-19 vaccine status: Information provided on how to obtain vaccines.   Qualifies for Shingles Vaccine? Yes   Zostavax completed No   Shingrix Completed?: No.    Education has been provided regarding the importance of this vaccine. Patient has been advised to call insurance company to determine out of pocket expense if they have not yet received this vaccine. Advised may also receive vaccine at local pharmacy or Health Dept. Verbalized acceptance and understanding.  Screening Tests Health Maintenance  Topic Date Due   DTaP/Tdap/Td (1 - Tdap) Never done   Zoster Vaccines- Shingrix (1 of 2) Never done   INFLUENZA VACCINE  04/16/2023   Medicare Annual Wellness (AWV)  02/20/2024   Pneumonia Vaccine 79+ Years old  Completed   DEXA SCAN  Completed   HPV VACCINES  Aged Out   COVID-19 Vaccine  Discontinued    Health Maintenance  Health Maintenance Due  Topic Date Due   DTaP/Tdap/Td (1 - Tdap) Never done   Zoster Vaccines- Shingrix (1 of 2) Never done    Colorectal cancer screening: Type of screening: Colonoscopy. Completed 10/12/2018. Repeat every n/a years  Mammogram status: Completed 10/13/2022. Repeat every year  Bone Density status: Completed 10/07/21. Results reflect: Bone density results: OSTEOPENIA. Repeat every 5 years.  Lung Cancer Screening: (Low Dose CT Chest recommended if Age 28-80 years, 30 pack-year currently smoking OR have quit w/in 15years.) does not qualify.    Additional  Screening:  Hepatitis C Screening: does not qualify;  Vision Screening: Recommended annual ophthalmology exams for early detection of glaucoma and other disorders of the eye. Is the patient up to date with their annual eye exam?  Yes  Who is the provider or what is the name of the office in which the patient attends annual eye exams? Dr.Groat   Dental Screening: Recommended annual dental exams for proper oral hygiene  Community Resource Referral / Chronic Care Management: CRR required this visit?  No   CCM required this visit?  No      Plan:     I have personally reviewed and noted the following in the patient's chart:   Medical and social history Use of alcohol, tobacco or illicit drugs  Current medications and supplements including opioid prescriptions. Patient is not currently taking opioid prescriptions. Functional ability and status Nutritional status Physical activity Advanced directives List of other physicians Hospitalizations, surgeries, and ER visits in previous 12 months Vitals Screenings to include cognitive, depression, and falls Referrals and appointments  In addition, I have reviewed and discussed with patient certain preventive protocols, quality metrics, and best practice recommendations. A written personalized care plan for preventive services as well as general preventive health recommendations were provided to patient.    Due to this being a telephonic visit, the after visit summary with patients personalized plan  was offered to patient via mail or my-chart. Per request, patient was mailed a copy of their AVS.  Jordan Hawks Duong Haydel, CMA   02/20/2023

## 2023-02-20 NOTE — Patient Instructions (Signed)
Debra George , Thank you for taking time to come for your Medicare Wellness Visit. I appreciate your ongoing commitment to your health goals. Please review the following plan we discussed and let me know if I can assist you in the future.   These are the goals we discussed:  Goals      Exercise 3x per week (30 min per time)     Continue to stay active and eat healthy.     Patient Stated     Patient states her goal is to remain as active and independent as she currently is.         This is a list of the screening recommended for you and due dates:  Health Maintenance  Topic Date Due   DTaP/Tdap/Td vaccine (1 - Tdap) Never done   Zoster (Shingles) Vaccine (1 of 2) Never done   Flu Shot  04/16/2023   Medicare Annual Wellness Visit  02/20/2024   Pneumonia Vaccine  Completed   DEXA scan (bone density measurement)  Completed   HPV Vaccine  Aged Out   COVID-19 Vaccine  Discontinued    Advanced directives: Advance directive discussed with you today. Even though you declined this today, please call our office should you change your mind, and we can give you the proper paperwork for you to fill out. Advance care planning is a way to make decisions about medical care that fits your values in case you are ever unable to make these decisions for yourself.  Information on Advanced Care Planning can be found at [facility] of [place] Advance Health Care Directives Advance Health Care Directives ([email or web address]    Conditions/risks identified: Keep doing what you're doing!!! Exercising to Stay Healthy To become healthy and stay healthy, it is recommended that you do moderate-intensity and vigorous-intensity exercise. You can tell that you are exercising at a moderate intensity if your heart starts beating faster and you start breathing faster but can still hold a conversation. You can tell that you are exercising at a vigorous intensity if you are breathing much harder and faster and cannot  hold a conversation while exercising. How can exercise benefit me? Exercising regularly is important. It has many health benefits, such as: Improving overall fitness, flexibility, and endurance. Increasing bone density. Helping with weight control. Decreasing body fat. Increasing muscle strength and endurance. Reducing stress and tension, anxiety, depression, or anger. Improving overall health. What guidelines should I follow while exercising? Before you start a new exercise program, talk with your health care provider. Do not exercise so much that you hurt yourself, feel dizzy, or get very short of breath. Wear comfortable clothes and wear shoes with good support. Drink plenty of water while you exercise to prevent dehydration or heat stroke. Work out until your breathing and your heartbeat get faster (moderate intensity). How often should I exercise? Choose an activity that you enjoy, and set realistic goals. Your health care provider can help you make an activity plan that is individually designed and works best for you. Exercise regularly as told by your health care provider. This may include: Doing strength training two times a week, such as: Lifting weights. Using resistance bands. Push-ups. Sit-ups. Yoga. Doing a certain intensity of exercise for a given amount of time. Choose from these options: A total of 150 minutes of moderate-intensity exercise every week. A total of 75 minutes of vigorous-intensity exercise every week. A mix of moderate-intensity and vigorous-intensity exercise every week. Children, pregnant women, people who  have not exercised regularly, people who are overweight, and older adults may need to talk with a health care provider about what activities are safe to perform. If you have a medical condition, be sure to talk with your health care provider before you start a new exercise program. What are some exercise ideas? Moderate-intensity exercise ideas  include: Walking 1 mile (1.6 km) in about 15 minutes. Biking. Hiking. Golfing. Dancing. Water aerobics. Vigorous-intensity exercise ideas include: Walking 4.5 miles (7.2 km) or more in about 1 hour. Jogging or running 5 miles (8 km) in about 1 hour. Biking 10 miles (16.1 km) or more in about 1 hour. Lap swimming. Roller-skating or in-line skating. Cross-country skiing. Vigorous competitive sports, such as football, basketball, and soccer. Jumping rope. Aerobic dancing. What are some everyday activities that can help me get exercise? Yard work, such as: Child psychotherapist. Raking and bagging leaves. Washing your car. Pushing a stroller. Shoveling snow. Gardening. Washing windows or floors. How can I be more active in my day-to-day activities? Use stairs instead of an elevator. Take a walk during your lunch break. If you drive, park your car farther away from your work or school. If you take public transportation, get off one stop early and walk the rest of the way. Stand up or walk around during all of your indoor phone calls. Get up, stretch, and walk around every 30 minutes throughout the day. Enjoy exercise with a friend. Support to continue exercising will help you keep a regular routine of activity. Where to find more information You can find more information about exercising to stay healthy from: U.S. Department of Health and Human Services: ThisPath.fi Centers for Disease Control and Prevention (CDC): FootballExhibition.com.br Summary Exercising regularly is important. It will improve your overall fitness, flexibility, and endurance. Regular exercise will also improve your overall health. It can help you control your weight, reduce stress, and improve your bone density. Do not exercise so much that you hurt yourself, feel dizzy, or get very short of breath. Before you start a new exercise program, talk with your health care provider. This information is not intended to replace  advice given to you by your health care provider. Make sure you discuss any questions you have with your health care provider. Document Revised: 12/28/2020 Document Reviewed: 12/28/2020 Elsevier Patient Education  2024 Elsevier Inc.   Next appointment: Follow up in one year for your annual wellness visit 02/26/2024 at 10am TELEPHONE VISIT   Preventive Care 65 Years and Older, Female Preventive care refers to lifestyle choices and visits with your health care provider that can promote health and wellness. What does preventive care include? A yearly physical exam. This is also called an annual well check. Dental exams once or twice a year. Routine eye exams. Ask your health care provider how often you should have your eyes checked. Personal lifestyle choices, including: Daily care of your teeth and gums. Regular physical activity. Eating a healthy diet. Avoiding tobacco and drug use. Limiting alcohol use. Practicing safe sex. Taking low-dose aspirin every day. Taking vitamin and mineral supplements as recommended by your health care provider. What happens during an annual well check? The services and screenings done by your health care provider during your annual well check will depend on your age, overall health, lifestyle risk factors, and family history of disease. Counseling  Your health care provider may ask you questions about your: Alcohol use. Tobacco use. Drug use. Emotional well-being. Home and relationship well-being. Sexual activity.  Eating habits. History of falls. Memory and ability to understand (cognition). Work and work Astronomer. Reproductive health. Screening  You may have the following tests or measurements: Height, weight, and BMI. Blood pressure. Lipid and cholesterol levels. These may be checked every 5 years, or more frequently if you are over 63 years old. Skin check. Lung cancer screening. You may have this screening every year starting at age 5  if you have a 30-pack-year history of smoking and currently smoke or have quit within the past 15 years. Fecal occult blood test (FOBT) of the stool. You may have this test every year starting at age 53. Flexible sigmoidoscopy or colonoscopy. You may have a sigmoidoscopy every 5 years or a colonoscopy every 10 years starting at age 55. Hepatitis C blood test. Hepatitis B blood test. Sexually transmitted disease (STD) testing. Diabetes screening. This is done by checking your blood sugar (glucose) after you have not eaten for a while (fasting). You may have this done every 1-3 years. Bone density scan. This is done to screen for osteoporosis. You may have this done starting at age 66. Mammogram. This may be done every 1-2 years. Talk to your health care provider about how often you should have regular mammograms. Talk with your health care provider about your test results, treatment options, and if necessary, the need for more tests. Vaccines  Your health care provider may recommend certain vaccines, such as: Influenza vaccine. This is recommended every year. Tetanus, diphtheria, and acellular pertussis (Tdap, Td) vaccine. You may need a Td booster every 10 years. Zoster vaccine. You may need this after age 30. Pneumococcal 13-valent conjugate (PCV13) vaccine. One dose is recommended after age 56. Pneumococcal polysaccharide (PPSV23) vaccine. One dose is recommended after age 37. Talk to your health care provider about which screenings and vaccines you need and how often you need them. This information is not intended to replace advice given to you by your health care provider. Make sure you discuss any questions you have with your health care provider. Document Released: 09/28/2015 Document Revised: 05/21/2016 Document Reviewed: 07/03/2015 Elsevier Interactive Patient Education  2017 [employer].  Fall Prevention in the Home Falls can cause injuries. They can happen to people of all ages.  There are many things you can do to make your home safe and to help prevent falls. What can I do on the outside of my home? Regularly fix the edges of walkways and driveways and fix any cracks. Remove anything that might make you trip as you walk through a door, such as a raised step or threshold. Trim any bushes or trees on the path to your home. Use bright outdoor lighting. Clear any walking paths of anything that might make someone trip, such as rocks or tools. Regularly check to see if handrails are loose or broken. Make sure that both sides of any steps have handrails. Any raised decks and porches should have guardrails on the edges. Have any leaves, snow, or ice cleared regularly. Use sand or salt on walking paths during winter. Clean up any spills in your garage right away. This includes oil or grease spills. What can I do in the bathroom? Use night lights. Install grab bars by the toilet and in the tub and shower. Do not use towel bars as grab bars. Use non-skid mats or decals in the tub or shower. If you need to sit down in the shower, use a plastic, non-slip stool. Keep the floor dry. Clean up any water  that spills on the floor as soon as it happens. Remove soap buildup in the tub or shower regularly. Attach bath mats securely with double-sided non-slip rug tape. Do not have throw rugs and other things on the floor that can make you trip. What can I do in the bedroom? Use night lights. Make sure that you have a light by your bed that is easy to reach. Do not use any sheets or blankets that are too big for your bed. They should not hang down onto the floor. Have a firm chair that has side arms. You can use this for support while you get dressed. Do not have throw rugs and other things on the floor that can make you trip. What can I do in the kitchen? Clean up any spills right away. Avoid walking on wet floors. Keep items that you use a lot in easy-to-reach places. If you need  to reach something above you, use a strong step stool that has a grab bar. Keep electrical cords out of the way. Do not use floor polish or wax that makes floors slippery. If you must use wax, use non-skid floor wax. Do not have throw rugs and other things on the floor that can make you trip. What can I do with my stairs? Do not leave any items on the stairs. Make sure that there are handrails on both sides of the stairs and use them. Fix handrails that are broken or loose. Make sure that handrails are as long as the stairways. Check any carpeting to make sure that it is firmly attached to the stairs. Fix any carpet that is loose or worn. Avoid having throw rugs at the top or bottom of the stairs. If you do have throw rugs, attach them to the floor with carpet tape. Make sure that you have a light switch at the top of the stairs and the bottom of the stairs. If you do not have them, ask someone to add them for you. What else can I do to help prevent falls? Wear shoes that: Do not have high heels. Have rubber bottoms. Are comfortable and fit you well. Are closed at the toe. Do not wear sandals. If you use a stepladder: Make sure that it is fully opened. Do not climb a closed stepladder. Make sure that both sides of the stepladder are locked into place. Ask someone to hold it for you, if possible. Clearly mark and make sure that you can see: Any grab bars or handrails. First and last steps. Where the edge of each step is. Use tools that help you move around (mobility aids) if they are needed. These include: Canes. Walkers. Scooters. Crutches. Turn on the lights when you go into a dark area. Replace any light bulbs as soon as they burn out. Set up your furniture so you have a clear path. Avoid moving your furniture around. If any of your floors are uneven, fix them. If there are any pets around you, be aware of where they are. Review your medicines with your doctor. Some medicines can  make you feel dizzy. This can increase your chance of falling. Ask your doctor what other things that you can do to help prevent falls. This information is not intended to replace advice given to you by your health care provider. Make sure you discuss any questions you have with your health care provider. Document Released: 06/28/2009 Document Revised: 02/07/2016 Document Reviewed: 10/06/2014 Elsevier Interactive Patient Education  2017 [employer].

## 2023-03-17 ENCOUNTER — Ambulatory Visit (INDEPENDENT_AMBULATORY_CARE_PROVIDER_SITE_OTHER): Payer: Medicare Other | Admitting: Family Medicine

## 2023-03-17 VITALS — BP 181/68 | HR 100 | Temp 98.1°F | Ht 68.0 in | Wt 114.4 lb

## 2023-03-17 DIAGNOSIS — M7989 Other specified soft tissue disorders: Secondary | ICD-10-CM | POA: Insufficient documentation

## 2023-03-17 DIAGNOSIS — I10 Essential (primary) hypertension: Secondary | ICD-10-CM

## 2023-03-17 NOTE — Assessment & Plan Note (Signed)
Normal exam today.  Advised supportive care and continued activity.

## 2023-03-17 NOTE — Assessment & Plan Note (Signed)
BP elevated here today.  She states that last night her blood pressure was 120 systolic.

## 2023-03-17 NOTE — Progress Notes (Signed)
Subjective:  Patient ID: Debra George, female    DOB: 10/13/1940  Age: 82 y.o. MRN: [id number]  CC: Chief Complaint  Patient presents with   Cyst    Knot on top of both feet, pt states they are not painful in anyway.    HPI:  82 year old female presents for evaluation of the above.  Patient reports that she recently went walking which she does regularly.  She noticed a raised area on the dorsum of both feet.  This has been bothering her.  No significant pain.  This is not limiting activity.  Patient concerned about this and wants it to be examined today.  Patient Active Problem List   Diagnosis Date Noted   Foot swelling 03/17/2023   Callus between toes 10/03/2022   Anxiety 09/22/2022   Osteoporosis 10/09/2021   Hyperlipidemia 03/25/2021   White coat syndrome with hypertension 02/04/2021   H/O adenomatous polyp of colon 10/02/2015    Social Hx   Social History   Socioeconomic History   Marital status: Married    Spouse name: Optometrist   Number of children: 1   Years of education: Not on file   Highest education level: Not on file  Occupational History   Not on file  Tobacco Use   Smoking status: Former   Smokeless tobacco: Never   Tobacco comments:    social smoker, quit 1990  Substance and Sexual Activity   Alcohol use: No    Alcohol/week: 0.0 standard drinks of alcohol   Drug use: No   Sexual activity: Not on file  Other Topics Concern   Not on file  Social History Narrative   Lives with husband [place].   Son also lives with patient.    Walks 2 miles per day, 5 days per week.       2023/03/16 Husband passed away in 08/15/2022  Social Determinants of Health   Financial Resource Strain: Low Risk  (March 16, 2023)   Overall Financial Resource Strain (CARDIA)    Difficulty of Paying Living Expenses: Not hard at all  Food Insecurity: No Food Insecurity (March 16, 2023)   Hunger Vital Sign    Worried About Running Out of Food in the Last Year: Never true    Ran Out of  Food in the Last Year: Never true  Transportation Needs: No Transportation Needs (2023-03-16)   PRAPARE - Administrator, Civil Service (Medical): No    Lack of Transportation (Non-Medical): No  Physical Activity: Sufficiently Active (03-16-23)   Exercise Vital Sign    Days of Exercise per Week: 7 days    Minutes of Exercise per Session: 30 min  Stress: No Stress Concern Present (16-Mar-2023)   [name] - Occupational Stress Questionnaire    Feeling of Stress : Not at all  Social Connections: Socially Integrated (03-16-2023)   Social Connection and Isolation Panel [NHANES]    Frequency of Communication with Friends and Family: More than three times a week    Frequency of Social Gatherings with Friends and Family: More than three times a week    Attends Religious Services: More than 4 times per year    Active Member of [employer] or Organizations: Yes    Attends Engineer, structural: More than 4 times per year    Marital Status: Married    Review of Systems Per HPI  Objective:  BP (!) 181/68   Pulse 100   Temp 98.1 F (36.7 C)  Ht 5\' 8"  (1.727 m)   Wt 114 lb 6.4 oz (51.9 kg)   SpO2 98%   BMI 17.39 kg/m      03/17/2023    8:39 AM 02/20/2023   10:25 AM 12/29/2022   10:45 AM  BP/Weight  Systolic BP 181 127 134  Diastolic BP 68 67 86  Wt. (Lbs) 114.4 116 116  BMI 17.39 kg/m2 17.64 kg/m2 17.64 kg/m2    Physical Exam Constitutional:      General: She is not in acute distress.    Appearance: Normal appearance.  Cardiovascular:     Rate and Rhythm: Normal rate and regular rhythm.  Feet:     Comments: No swelling, erythema, or lesions of the feet.  Normal exam. Neurological:     Mental Status: She is alert.  Psychiatric:     Comments: Anxious.     Lab Results  Component Value Date   WBC 6.2 11/09/2022   HGB 15.0 11/09/2022   HCT 44.2 11/09/2022   PLT 167 11/09/2022   GLUCOSE 105 (H) 11/09/2022   CHOL 188 01/22/2021    TRIG 63 01/22/2021   HDL 68 01/22/2021   LDLCALC 108 (H) 01/22/2021   ALT 20 11/09/2022   AST 25 11/09/2022   NA 141 11/09/2022   K 3.7 11/09/2022   CL 105 11/09/2022   CREATININE 0.60 11/09/2022   BUN 12 11/09/2022   CO2 27 11/09/2022     Assessment & Plan:   Problem List Items Addressed This Visit       Cardiovascular and Mediastinum   White coat syndrome with hypertension    BP elevated here today.  She states that last night her blood pressure was 120 systolic.        Other   Foot swelling - Primary    Normal exam today.  Advised supportive care and continued activity.       Everlene Other DO [facility]

## 2023-03-17 NOTE — Patient Instructions (Addendum)
Exam normal.  Nothing to worry about.  Take care  Dr. Adriana Simas

## 2023-04-30 ENCOUNTER — Ambulatory Visit: Payer: Medicare Other | Admitting: Family Medicine

## 2023-04-30 VITALS — BP 164/71 | HR 98 | Wt 118.2 lb

## 2023-04-30 DIAGNOSIS — S61411A Laceration without foreign body of right hand, initial encounter: Secondary | ICD-10-CM

## 2023-04-30 DIAGNOSIS — S61419A Laceration without foreign body of unspecified hand, initial encounter: Secondary | ICD-10-CM | POA: Insufficient documentation

## 2023-04-30 NOTE — Assessment & Plan Note (Signed)
Healing skin tear.  No evidence of infection.  Advised supportive care.

## 2023-04-30 NOTE — Progress Notes (Signed)
Subjective:  Patient ID: Debra George, female    DOB: 05-29-1941  Age: 82 y.o. MRN: [id number]  CC:  Injury  HPI:  82 year old presents for evaluation of the above.  Patient reports that on Saturday she bumped her right hand.  He suffered a skin tear.  She states that it seems to be improving but she wanted to be examined to ensure that it is okay.  She has been applying an antibacterial Band-Aid.  No current drainage.  No erythema.   Patient Active Problem List   Diagnosis Date Noted   Skin tear of hand without complication 04/30/2023   Callus between toes 10/03/2022   Anxiety 09/22/2022   Osteoporosis 10/09/2021   Hyperlipidemia 03/25/2021   White coat syndrome with hypertension 02/04/2021   H/O adenomatous polyp of colon 10/02/2015    Social Hx   Social History   Socioeconomic History   Marital status: Married    Spouse name: Optometrist   Number of children: 1   Years of education: Not on file   Highest education level: Not on file  Occupational History   Not on file  Tobacco Use   Smoking status: Former   Smokeless tobacco: Never   Tobacco comments:    social smoker, quit 1990  Substance and Sexual Activity   Alcohol use: No    Alcohol/week: 0.0 standard drinks of alcohol   Drug use: No   Sexual activity: Not on file  Other Topics Concern   Not on file  Social History Narrative   Lives with husband [place].   Son also lives with patient.    Walks 2 miles per day, 5 days per week.       03-08-2023 Husband passed away in 18-Aug-2022  Social Determinants of Health   Financial Resource Strain: Low Risk  (03-08-23)   Overall Financial Resource Strain (CARDIA)    Difficulty of Paying Living Expenses: Not hard at all  Food Insecurity: No Food Insecurity (2023/03/08)   Hunger Vital Sign    Worried About Running Out of Food in the Last Year: Never true    Ran Out of Food in the Last Year: Never true  Transportation Needs: No Transportation Needs (03/08/23)   PRAPARE  - Administrator, Civil Service (Medical): No    Lack of Transportation (Non-Medical): No  Physical Activity: Sufficiently Active (March 08, 2023)   Exercise Vital Sign    Days of Exercise per Week: 7 days    Minutes of Exercise per Session: 30 min  Stress: No Stress Concern Present (03-08-2023)   [name] - Occupational Stress Questionnaire    Feeling of Stress : Not at all  Social Connections: Socially Integrated (08-Mar-2023)   Social Connection and Isolation Panel [NHANES]    Frequency of Communication with Friends and Family: More than three times a week    Frequency of Social Gatherings with Friends and Family: More than three times a week    Attends Religious Services: More than 4 times per year    Active Member of [employer] or Organizations: Yes    Attends Engineer, structural: More than 4 times per year    Marital Status: Married    Review of Systems Per HPI  Objective:  BP (!) 164/71   Pulse 98   Wt 118 lb 3.2 oz (53.6 kg)   SpO2 98%   BMI 17.97 kg/m      04/30/2023    4:14 PM 03/17/2023  8:39 AM 02/20/2023   10:25 AM  BP/Weight  Systolic BP 164 181 127  Diastolic BP 71 68 67  Wt. (Lbs) 118.2 114.4 116  BMI 17.97 kg/m2 17.39 kg/m2 17.64 kg/m2    Physical Exam Constitutional:      General: She is not in acute distress.    Appearance: Normal appearance.  HENT:     Head: Normocephalic and atraumatic.  Skin:    Comments: Dorsum of the right hand with skin tear.  Skin tear is healing.  No current drainage.  No erythema.  No fluctuance.  No warmth.  Neurological:     Mental Status: She is alert.     Lab Results  Component Value Date   WBC 6.2 11/09/2022   HGB 15.0 11/09/2022   HCT 44.2 11/09/2022   PLT 167 11/09/2022   GLUCOSE 105 (H) 11/09/2022   CHOL 188 01/22/2021   TRIG 63 01/22/2021   HDL 68 01/22/2021   LDLCALC 108 (H) 01/22/2021   ALT 20 11/09/2022   AST 25 11/09/2022   NA 141 11/09/2022   K 3.7  11/09/2022   CL 105 11/09/2022   CREATININE 0.60 11/09/2022   BUN 12 11/09/2022   CO2 27 11/09/2022     Assessment & Plan:   Problem List Items Addressed This Visit       Musculoskeletal and Integument   Skin tear of hand without complication - Primary    Healing skin tear.  No evidence of infection.  Advised supportive care.      Everlene Other DO [facility]

## 2023-06-30 ENCOUNTER — Ambulatory Visit: Payer: Medicare Other | Admitting: Family Medicine

## 2023-06-30 VITALS — BP 164/88 | HR 96 | Temp 98.4°F | Ht 68.0 in | Wt 121.2 lb

## 2023-06-30 DIAGNOSIS — F419 Anxiety disorder, unspecified: Secondary | ICD-10-CM | POA: Diagnosis not present

## 2023-06-30 DIAGNOSIS — I10 Essential (primary) hypertension: Secondary | ICD-10-CM | POA: Diagnosis not present

## 2023-06-30 NOTE — Patient Instructions (Signed)
Check BP @ home and log readings.  Follow up in 6 months.

## 2023-06-30 NOTE — Progress Notes (Signed)
Subjective:  Patient ID: Debra George, female    DOB: February 18, 1941  Age: 82 y.o. MRN: [id number]  CC: Follow-up   HPI:  82 year old female presents for follow-up.  Patient has known whitecoat hypertension.  Blood pressure readings are well-controlled at home.  Elevated here today.  Good appetite.  Patient trying to put on some weight.  She has gained 3 pounds since August.  Staying active.  Walking regularly.  She states that she is feeling well.  Anxiety is stable currently.  She is no longer taking Zoloft.  Patient Active Problem List   Diagnosis Date Noted   Anxiety 09/22/2022   Osteoporosis 10/09/2021   Hyperlipidemia 03/25/2021   White coat syndrome with hypertension 02/04/2021    Social Hx   Social History   Socioeconomic History   Marital status: Married    Spouse name: Optometrist   Number of children: 1   Years of education: Not on file   Highest education level: Not on file  Occupational History   Not on file  Tobacco Use   Smoking status: Former   Smokeless tobacco: Never   Tobacco comments:    social smoker, quit 1990  Substance and Sexual Activity   Alcohol use: No    Alcohol/week: 0.0 standard drinks of alcohol   Drug use: No   Sexual activity: Not on file  Other Topics Concern   Not on file  Social History Narrative   Lives with husband Narcissa.   Son also lives with patient.    Walks 2 miles per day, 5 days per week.       03-10-23 Husband passed away in 2022/08/21  Social Determinants of Health   Financial Resource Strain: Low Risk  (10-Mar-2023)   Overall Financial Resource Strain (CARDIA)    Difficulty of Paying Living Expenses: Not hard at all  Food Insecurity: No Food Insecurity (03-10-2023)   Hunger Vital Sign    Worried About Running Out of Food in the Last Year: Never true    Ran Out of Food in the Last Year: Never true  Transportation Needs: No Transportation Needs (10-Mar-2023)   PRAPARE - Administrator, Civil Service  (Medical): No    Lack of Transportation (Non-Medical): No  Physical Activity: Sufficiently Active (Mar 10, 2023)   Exercise Vital Sign    Days of Exercise per Week: 7 days    Minutes of Exercise per Session: 30 min  Stress: No Stress Concern Present (Mar 10, 2023)   [name] - Occupational Stress Questionnaire    Feeling of Stress : Not at all  Social Connections: Socially Integrated (03/10/23)   Social Connection and Isolation Panel [NHANES]    Frequency of Communication with Friends and Family: More than three times a week    Frequency of Social Gatherings with Friends and Family: More than three times a week    Attends Religious Services: More than 4 times per year    Active Member of [employer] or Organizations: Yes    Attends Engineer, structural: More than 4 times per year    Marital Status: Married    Review of Systems Per HPI  Objective:  BP (!) 164/88   Pulse 96   Temp 98.4 F (36.9 C) (Oral)   Ht 5\' 8"  (1.727 m)   Wt 121 lb 3.2 oz (55 kg)   SpO2 100%   BMI 18.43 kg/m      06/30/2023   10:13 AM 06/30/2023  9:53 AM 04/30/2023    4:14 PM  BP/Weight  Systolic BP 164 166 164  Diastolic BP 88 92 71  Wt. (Lbs)  121.2 118.2  BMI  18.43 kg/m2 17.97 kg/m2    Physical Exam Vitals and nursing note reviewed.  Constitutional:      General: She is not in acute distress.    Appearance: Normal appearance.  HENT:     Head: Normocephalic and atraumatic.  Eyes:     General:        Right eye: No discharge.        Left eye: No discharge.     Conjunctiva/sclera: Conjunctivae normal.  Cardiovascular:     Rate and Rhythm: Normal rate and regular rhythm.     Heart sounds: Murmur heard.  Pulmonary:     Effort: Pulmonary effort is normal.     Breath sounds: Normal breath sounds. No wheezing, rhonchi or rales.  Neurological:     Mental Status: She is alert.  Psychiatric:        Mood and Affect: Mood normal.        Behavior: Behavior normal.      Lab Results  Component Value Date   WBC 6.2 11/09/2022   HGB 15.0 11/09/2022   HCT 44.2 11/09/2022   PLT 167 11/09/2022   GLUCOSE 105 (H) 11/09/2022   CHOL 188 01/22/2021   TRIG 63 01/22/2021   HDL 68 01/22/2021   LDLCALC 108 (H) 01/22/2021   ALT 20 11/09/2022   AST 25 11/09/2022   NA 141 11/09/2022   K 3.7 11/09/2022   CL 105 11/09/2022   CREATININE 0.60 11/09/2022   BUN 12 11/09/2022   CO2 27 11/09/2022     Assessment & Plan:   Problem List Items Addressed This Visit       Cardiovascular and Mediastinum   White coat syndrome with hypertension - Primary    BP elevated here today.  Patient will continue to monitor her blood pressures at home.  She will bring in her log at her next visit.        Other   Anxiety    Stable currently.      Follow-up:  Return in about 6 months (around 12/29/2023).  Everlene Other DO [facility]

## 2023-06-30 NOTE — Assessment & Plan Note (Signed)
Stable currently

## 2023-06-30 NOTE — Assessment & Plan Note (Signed)
BP elevated here today.  Patient will continue to monitor her blood pressures at home.  She will bring in her log at her next visit.

## 2023-07-01 ENCOUNTER — Other Ambulatory Visit: Payer: Self-pay | Admitting: Family Medicine

## 2023-07-01 MED ORDER — AMLODIPINE BESYLATE 2.5 MG PO TABS: 2.5000 mg | ORAL_TABLET | Freq: Every day | ORAL | 0 refills | Status: DC

## 2023-07-15 ENCOUNTER — Ambulatory Visit (INDEPENDENT_AMBULATORY_CARE_PROVIDER_SITE_OTHER): Payer: Medicare Other | Admitting: Family Medicine

## 2023-07-15 VITALS — BP 150/96 | HR 99 | Wt 121.4 lb

## 2023-07-15 DIAGNOSIS — I10 Essential (primary) hypertension: Secondary | ICD-10-CM | POA: Diagnosis not present

## 2023-07-15 NOTE — Assessment & Plan Note (Signed)
Improved but not yet at goal.  Increasing amlodipine to 5 mg daily.  Follow-up in 1 month.

## 2023-07-15 NOTE — Patient Instructions (Signed)
Increase the Norvasc to 5 mg (2 tablets daily).  Check BP at home.  Follow up in 1 month.

## 2023-07-15 NOTE — Progress Notes (Signed)
Subjective:  Patient ID: Debra George, female    DOB: 13-Aug-1941  Age: 82 y.o. MRN: [id number]  CC:  Follow up HTN   HPI:  82 year old female presents for follow-up regarding hypertension.  Blood pressure readings have improved but are still elevated above goal.  She is tolerating amlodipine 2.5 mg daily.  She has had no adverse side effects.  No chest pain or shortness of breath.  No other complaints or concerns at this time.  Patient Active Problem List   Diagnosis Date Noted   Anxiety 09/22/2022   Osteoporosis 10/09/2021   Hyperlipidemia 03/25/2021   White coat syndrome with hypertension 02/04/2021    Social Hx   Social History   Socioeconomic History   Marital status: Married    Spouse name: Optometrist   Number of children: 1   Years of education: Not on file   Highest education level: Not on file  Occupational History   Not on file  Tobacco Use   Smoking status: Former   Smokeless tobacco: Never   Tobacco comments:    social smoker, quit 1990  Substance and Sexual Activity   Alcohol use: No    Alcohol/week: 0.0 standard drinks of alcohol   Drug use: No   Sexual activity: Not on file  Other Topics Concern   Not on file  Social History Narrative   Lives with husband Debra George.   Son also lives with patient.    Walks 2 miles per day, 5 days per week.       02/24/2023 Husband passed away in 07-23-22  Social Determinants of Health   Financial Resource Strain: Low Risk  (02/24/23)   Overall Financial Resource Strain (CARDIA)    Difficulty of Paying Living Expenses: Not hard at all  Food Insecurity: No Food Insecurity (24-Feb-2023)   Hunger Vital Sign    Worried About Running Out of Food in the Last Year: Never true    Ran Out of Food in the Last Year: Never true  Transportation Needs: No Transportation Needs (02-24-2023)   PRAPARE - Administrator, Civil Service (Medical): No    Lack of Transportation (Non-Medical): No  Physical Activity: Sufficiently  Active (02/24/23)   Exercise Vital Sign    Days of Exercise per Week: 7 days    Minutes of Exercise per Session: 30 min  Stress: No Stress Concern Present (24-Feb-2023)   [employer] of Occupational Health - Occupational Stress Questionnaire    Feeling of Stress : Not at all  Social Connections: Socially Integrated (2023-02-24)   Social Connection and Isolation Panel [NHANES]    Frequency of Communication with Friends and Family: More than three times a week    Frequency of Social Gatherings with Friends and Family: More than three times a week    Attends Religious Services: More than 4 times per year    Active Member of [employer] or Organizations: Yes    Attends Engineer, structural: More than 4 times per year    Marital Status: Married    Review of Systems Per HPI  Objective:  BP (!) 150/96   Pulse 99   Wt 121 lb 6.4 oz (55.1 kg)   SpO2 98%   BMI 18.46 kg/m      07/15/2023    2:57 PM 07/15/2023    2:22 PM 06/30/2023   10:13 AM  BP/Weight  Systolic BP 150 160 164  Diastolic BP 96 100 88  Wt. (Lbs)  121.4  BMI  18.46 kg/m2     Physical Exam Vitals and nursing note reviewed.  Constitutional:      General: She is not in acute distress.    Appearance: Normal appearance.  HENT:     Head: Normocephalic and atraumatic.  Cardiovascular:     Rate and Rhythm: Normal rate and regular rhythm.  Pulmonary:     Effort: Pulmonary effort is normal.     Breath sounds: No wheezing or rales.  Neurological:     Mental Status: She is alert.     Lab Results  Component Value Date   WBC 6.2 11/09/2022   HGB 15.0 11/09/2022   HCT 44.2 11/09/2022   PLT 167 11/09/2022   GLUCOSE 105 (H) 11/09/2022   CHOL 188 01/22/2021   TRIG 63 01/22/2021   HDL 68 01/22/2021   LDLCALC 108 (H) 01/22/2021   ALT 20 11/09/2022   AST 25 11/09/2022   NA 141 11/09/2022   K 3.7 11/09/2022   CL 105 11/09/2022   CREATININE 0.60 11/09/2022   BUN 12 11/09/2022   CO2 27 11/09/2022      Assessment & Plan:   Problem List Items Addressed This Visit       Cardiovascular and Mediastinum   White coat syndrome with hypertension - Primary    Improved but not yet at goal.  Increasing amlodipine to 5 mg daily.  Follow-up in 1 month.       Follow-up:  Return in about 1 month (around 08/15/2023) for HTN follow up.  Everlene Other DO [facility]

## 2023-08-17 ENCOUNTER — Encounter: Payer: Self-pay | Admitting: Family Medicine

## 2023-08-17 ENCOUNTER — Ambulatory Visit (INDEPENDENT_AMBULATORY_CARE_PROVIDER_SITE_OTHER): Payer: Medicare Other | Admitting: Family Medicine

## 2023-08-17 VITALS — BP 144/72 | HR 98 | Temp 97.5°F | Ht 68.0 in | Wt 122.0 lb

## 2023-08-17 DIAGNOSIS — I10 Essential (primary) hypertension: Secondary | ICD-10-CM

## 2023-08-17 NOTE — Patient Instructions (Signed)
Follow up in 6 months.  Merry Christmas.  Take care  Dr. Adriana Simas

## 2023-08-17 NOTE — Progress Notes (Signed)
Subjective:  Patient ID: Debra George, female    DOB: 04/05/1941  Age: 82 y.o. MRN: [id number]  CC:   Chief Complaint  Patient presents with   Hypertension    1 month follow up    HPI:  82 year old female presents for follow up regarding HTN.  BP fairly well controlled here today. She is compliant with Norvasc 2.5 mg. She is tolerating well. Has some LE edema at the end of the day but it is resolved by the am. She is feeling well. Staying active. Continues to walk daily.   Patient Active Problem List   Diagnosis Date Noted   Anxiety 09/22/2022   Osteoporosis 10/09/2021   Hyperlipidemia 03/25/2021   White coat syndrome with hypertension 02/04/2021    Social Hx   Social History   Socioeconomic History   Marital status: Married    Spouse name: Optometrist   Number of children: 1   Years of education: Not on file   Highest education level: Not on file  Occupational History   Not on file  Tobacco Use   Smoking status: Former   Smokeless tobacco: Never   Tobacco comments:    social smoker, quit 1990  Substance and Sexual Activity   Alcohol use: No    Alcohol/week: 0.0 standard drinks of alcohol   Drug use: No   Sexual activity: Not on file  Other Topics Concern   Not on file  Social History Narrative   Lives with husband [place].   Son also lives with patient.    Walks 2 miles per day, 5 days per week.       03/20/23 Husband passed away in 2022-08-20  Social Determinants of Health   Financial Resource Strain: Low Risk  (03/20/23)   Overall Financial Resource Strain (CARDIA)    Difficulty of Paying Living Expenses: Not hard at all  Food Insecurity: No Food Insecurity (20-Mar-2023)   Hunger Vital Sign    Worried About Running Out of Food in the Last Year: Never true    Ran Out of Food in the Last Year: Never true  Transportation Needs: No Transportation Needs (20-Mar-2023)   PRAPARE - Administrator, Civil Service (Medical): No    Lack of Transportation  (Non-Medical): No  Physical Activity: Sufficiently Active (Mar 20, 2023)   Exercise Vital Sign    Days of Exercise per Week: 7 days    Minutes of Exercise per Session: 30 min  Stress: No Stress Concern Present (March 20, 2023)   [name] - Occupational Stress Questionnaire    Feeling of Stress : Not at all  Social Connections: Socially Integrated (03-20-2023)   Social Connection and Isolation Panel [NHANES]    Frequency of Communication with Friends and Family: More than three times a week    Frequency of Social Gatherings with Friends and Family: More than three times a week    Attends Religious Services: More than 4 times per year    Active Member of [employer] or Organizations: Yes    Attends Engineer, structural: More than 4 times per year    Marital Status: Married    Review of Systems Per HPI  Objective:  BP (!) 144/72   Pulse 98   Temp (!) 97.5 F (36.4 C)   Ht 5\' 8"  (1.727 m)   Wt 122 lb (55.3 kg)   SpO2 99%   BMI 18.55 kg/m      08/17/2023  2:26 PM 08/17/2023    2:08 PM 07/15/2023    2:57 PM  BP/Weight  Systolic BP 144 186 150  Diastolic BP 72 75 96  Wt. (Lbs)  122   BMI  18.55 kg/m2     Physical Exam Vitals and nursing note reviewed.  Constitutional:      General: She is not in acute distress.    Appearance: Normal appearance.  HENT:     Head: Normocephalic and atraumatic.  Cardiovascular:     Rate and Rhythm: Normal rate and regular rhythm.  Pulmonary:     Effort: Pulmonary effort is normal.     Breath sounds: Normal breath sounds.  Neurological:     Mental Status: She is alert.  Psychiatric:        Mood and Affect: Mood normal.        Behavior: Behavior normal.     Lab Results  Component Value Date   WBC 6.2 11/09/2022   HGB 15.0 11/09/2022   HCT 44.2 11/09/2022   PLT 167 11/09/2022   GLUCOSE 105 (H) 11/09/2022   CHOL 188 01/22/2021   TRIG 63 01/22/2021   HDL 68 01/22/2021   LDLCALC 108 (H) 01/22/2021   ALT  20 11/09/2022   AST 25 11/09/2022   NA 141 11/09/2022   K 3.7 11/09/2022   CL 105 11/09/2022   CREATININE 0.60 11/09/2022   BUN 12 11/09/2022   CO2 27 11/09/2022     Assessment & Plan:   Problem List Items Addressed This Visit       Cardiovascular and Mediastinum   White coat syndrome with hypertension - Primary    Fair control given advanced age. Continue current dose of Amlodipine.        Follow-up:  Return in about 6 months (around 02/15/2024).  Everlene Other DO [facility]

## 2023-08-17 NOTE — Assessment & Plan Note (Signed)
Fair control given advanced age. Continue current dose of Amlodipine.

## 2023-09-03 ENCOUNTER — Other Ambulatory Visit (HOSPITAL_COMMUNITY): Payer: Self-pay | Admitting: Family Medicine

## 2023-09-03 DIAGNOSIS — Z1231 Encounter for screening mammogram for malignant neoplasm of breast: Secondary | ICD-10-CM

## 2023-09-15 ENCOUNTER — Telehealth: Payer: Self-pay

## 2023-09-15 ENCOUNTER — Other Ambulatory Visit: Payer: Self-pay

## 2023-09-15 MED ORDER — AMLODIPINE BESYLATE 2.5 MG PO TABS: 2.5000 mg | ORAL_TABLET | Freq: Every day | ORAL | 0 refills | Status: DC

## 2023-09-15 NOTE — Telephone Encounter (Signed)
---   please advise dose ---- What is the name of the medication or equipment? amLODipine  (NORVASC ) 2.5 MG tablet Pt was told to increase to two and she did this caused her ankle to swell so she went back to 1 tablet a day. She only has enough to last her until Friday. She wants to know if Dr Bluford still wants to keep her on this medication of so send at 1 tablet by mouth one time a day said that seems to work for her.

## 2023-09-15 NOTE — Telephone Encounter (Signed)
 Prescription Request  09/15/2023  LOV: Visit date not found  What is the name of the medication or equipment? amLODipine  (NORVASC ) 2.5 MG tablet Pt was told to increase to two and she did this caused her ankle to swell so she went back to 1 tablet a day. She only has enough to last her until Friday. She wants to know if Dr Bluford still wants to keep her on this medication of so send at 1 tablet by mouth one time a day said that seems to work for her.   Please call patient when to Pharmacy   Have you contacted your pharmacy to request a refill? Yes   Which pharmacy would you like this sent to? [facility]   Patient notified that their request is being sent to the clinical staff for review and that they should receive a response within 2 business days.   Please advise at Mobile [phone] (mobile)

## 2023-09-16 ENCOUNTER — Other Ambulatory Visit: Payer: Self-pay | Admitting: Family Medicine

## 2023-09-16 MED ORDER — AMLODIPINE BESYLATE 2.5 MG PO TABS: 2.5000 mg | ORAL_TABLET | Freq: Every day | ORAL | 0 refills | Status: DC

## 2023-10-07 ENCOUNTER — Ambulatory Visit (INDEPENDENT_AMBULATORY_CARE_PROVIDER_SITE_OTHER): Payer: Medicare Other | Admitting: Family Medicine

## 2023-10-07 VITALS — BP 180/81 | HR 101 | Temp 97.9°F | Ht 68.0 in | Wt 124.2 lb

## 2023-10-07 DIAGNOSIS — J069 Acute upper respiratory infection, unspecified: Secondary | ICD-10-CM

## 2023-10-07 MED ORDER — CETIRIZINE HCL 5 MG PO TABS: 5.0000 mg | ORAL_TABLET | Freq: Every day | ORAL | 0 refills | Status: DC

## 2023-10-07 MED ORDER — IPRATROPIUM BROMIDE 0.06 % NA SOLN: 2.0000 | Freq: Four times a day (QID) | NASAL | 0 refills | Status: DC | PRN

## 2023-10-07 NOTE — Assessment & Plan Note (Signed)
Treating with Zyrtec and Atrovent.

## 2023-10-07 NOTE — Progress Notes (Signed)
Subjective:  Patient ID: Debra George, female    DOB: Nov 20, 1940  Age: 83 y.o. MRN: [id number]  CC:  Cold symptoms   HPI:  83 year old female presents for evaluation of the above.  Started Monday. Reports runny nose, temp ~ 99, left ear popping. Feels that she has a cold. Has not used an OTC treatment. No significant cough. Runny nose is the primary issue.  Patient Active Problem List   Diagnosis Date Noted   Viral URI 10/07/2023   Anxiety 09/22/2022   Osteoporosis 10/09/2021   Hyperlipidemia 03/25/2021   White coat syndrome with hypertension 02/04/2021    Social Hx   Social History   Socioeconomic History   Marital status: Married    Spouse name: Optometrist   Number of children: 1   Years of education: Not on file   Highest education level: Not on file  Occupational History   Not on file  Tobacco Use   Smoking status: Former   Smokeless tobacco: Never   Tobacco comments:    social smoker, quit 1990  Substance and Sexual Activity   Alcohol use: No    Alcohol/week: 0.0 standard drinks of alcohol   Drug use: No   Sexual activity: Not on file  Other Topics Concern   Not on file  Social History Narrative   Lives with husband [place].   Son also lives with patient.    Walks 2 miles per day, 5 days per week.       03/19/23 Husband passed away in 09-06-22  Social Drivers of Health   Financial Resource Strain: Low Risk  (03-19-23)   Overall Financial Resource Strain (CARDIA)    Difficulty of Paying Living Expenses: Not hard at all  Food Insecurity: No Food Insecurity (2023/03/19)   Hunger Vital Sign    Worried About Running Out of Food in the Last Year: Never true    Ran Out of Food in the Last Year: Never true  Transportation Needs: No Transportation Needs (03/19/2023)   PRAPARE - Administrator, Civil Service (Medical): No    Lack of Transportation (Non-Medical): No  Physical Activity: Sufficiently Active (Mar 19, 2023)   Exercise Vital Sign    Days of  Exercise per Week: 7 days    Minutes of Exercise per Session: 30 min  Stress: No Stress Concern Present (03/19/2023)   [name] - Occupational Stress Questionnaire    Feeling of Stress : Not at all  Social Connections: Socially Integrated (2023-03-19)   Social Connection and Isolation Panel [NHANES]    Frequency of Communication with Friends and Family: More than three times a week    Frequency of Social Gatherings with Friends and Family: More than three times a week    Attends Religious Services: More than 4 times per year    Active Member of [employer] or Organizations: Yes    Attends Engineer, structural: More than 4 times per year    Marital Status: Married    Review of Systems Per HPI  Objective:  BP (!) 180/81   Pulse (!) 101   Temp 97.9 F (36.6 C)   Ht 5\' 8"  (1.727 m)   Wt 124 lb 3.2 oz (56.3 kg)   SpO2 99%   BMI 18.88 kg/m      10/07/2023    3:05 PM 08/17/2023    2:26 PM 08/17/2023    2:08 PM  BP/Weight  Systolic BP 180 144 186  Diastolic BP 81 72 75  Wt. (Lbs) 124.2  122  BMI 18.88 kg/m2  18.55 kg/m2    Physical Exam Constitutional:      General: She is not in acute distress.    Appearance: Normal appearance.  HENT:     Head: Normocephalic and atraumatic.     Right Ear: Tympanic membrane normal.     Ears:     Comments: Slight effusion to the left ear.    Nose: Rhinorrhea present.  Cardiovascular:     Rate and Rhythm: Normal rate and regular rhythm.  Pulmonary:     Effort: Pulmonary effort is normal.     Breath sounds: Normal breath sounds. No wheezing, rhonchi or rales.  Neurological:     Mental Status: She is alert.     Lab Results  Component Value Date   WBC 6.2 11/09/2022   HGB 15.0 11/09/2022   HCT 44.2 11/09/2022   PLT 167 11/09/2022   GLUCOSE 105 (H) 11/09/2022   CHOL 188 01/22/2021   TRIG 63 01/22/2021   HDL 68 01/22/2021   LDLCALC 108 (H) 01/22/2021   ALT 20 11/09/2022   AST 25 11/09/2022   NA  141 11/09/2022   K 3.7 11/09/2022   CL 105 11/09/2022   CREATININE 0.60 11/09/2022   BUN 12 11/09/2022   CO2 27 11/09/2022     Assessment & Plan:   Problem List Items Addressed This Visit       Respiratory   Viral URI - Primary   Treating with Zyrtec and Atrovent.       Meds ordered this encounter  Medications   cetirizine (ZYRTEC) 5 MG tablet    Sig: Take 1 tablet (5 mg total) by mouth daily.    Dispense:  30 tablet    Refill:  0   ipratropium (ATROVENT) 0.06 % nasal spray    Sig: Place 2 sprays into both nostrils 4 (four) times daily as needed for rhinitis.    Dispense:  15 mL    Refill:  0    Follow-up:  Return if symptoms worsen or fail to improve.  Everlene Other DO [facility]

## 2023-10-14 ENCOUNTER — Encounter (HOSPITAL_COMMUNITY): Payer: Self-pay

## 2023-10-14 ENCOUNTER — Ambulatory Visit (HOSPITAL_COMMUNITY)
Admission: RE | Admit: 2023-10-14 | Discharge: 2023-10-14 | Disposition: A | Discharge status: Home / Self Care | Payer: Medicare Other | Source: Ambulatory Visit | Attending: Family Medicine | Admitting: Family Medicine

## 2023-10-14 DIAGNOSIS — Z1231 Encounter for screening mammogram for malignant neoplasm of breast: Secondary | ICD-10-CM | POA: Diagnosis not present

## 2023-12-29 ENCOUNTER — Ambulatory Visit: Payer: Medicare Other | Admitting: Family Medicine

## 2024-01-20 ENCOUNTER — Encounter: Payer: Self-pay | Admitting: Family Medicine

## 2024-01-20 ENCOUNTER — Ambulatory Visit: Admitting: Family Medicine

## 2024-01-20 VITALS — BP 172/69 | HR 101 | Temp 98.3°F | Ht 68.0 in | Wt 124.0 lb

## 2024-01-20 DIAGNOSIS — R051 Acute cough: Secondary | ICD-10-CM

## 2024-01-20 DIAGNOSIS — I10 Essential (primary) hypertension: Secondary | ICD-10-CM | POA: Diagnosis not present

## 2024-01-20 DIAGNOSIS — R059 Cough, unspecified: Secondary | ICD-10-CM | POA: Insufficient documentation

## 2024-01-20 MED ORDER — PROMETHAZINE-DM 6.25-15 MG/5ML PO SYRP: 5.0000 mL | ORAL_SOLUTION | Freq: Every evening | ORAL | 0 refills | Status: DC | PRN

## 2024-01-20 NOTE — Progress Notes (Signed)
 Subjective:  Patient ID: Debra  Laine George, female    DOB: 05-22-1941  Age: 83 y.o. MRN: [id number]  CC:   Chief Complaint  Patient presents with   Fever    Fevers for 2 days only in the afternoon of 99.1 taking tylenol       HPI:  83 year old female presents with respiratory symptoms.  Symptoms started on Monday.  She reports rhinorrhea.  Rhinorrhea has improved with use of Zyrtec .  She reports that she has had a dry cough as of last night.  Slightly elevated temperature of 99.1.  No other associated symptoms.  No other complaints.  Patient Active Problem List   Diagnosis Date Noted   Cough 01/20/2024   Anxiety 09/22/2022   Osteoporosis 10/09/2021   Hyperlipidemia 03/25/2021   White coat syndrome with hypertension 02/04/2021    Social Hx   Social History   Socioeconomic History   Marital status: Married    Spouse name: Optometrist   Number of children: 1   Years of education: Not on file   Highest education level: Not on file  Occupational History   Not on file  Tobacco Use   Smoking status: Former   Smokeless tobacco: Never   Tobacco comments:    social smoker, quit 1990  Substance and Sexual Activity   Alcohol use: No    Alcohol/week: 0.0 standard drinks of alcohol   Drug use: No   Sexual activity: Not on file  Other Topics Concern   Not on file  Social History Narrative   Lives with husband [place].   Son also lives with patient.    Walks 2 miles per day, 5 days per week.       21-Mar-2023 Husband passed away in 2022-08-25  Social Drivers of Health   Financial Resource Strain: Low Risk  (03-21-23)   Overall Financial Resource Strain (CARDIA)    Difficulty of Paying Living Expenses: Not hard at all  Food Insecurity: No Food Insecurity (2023-03-21)   Hunger Vital Sign    Worried About Running Out of Food in the Last Year: Never true    Ran Out of Food in the Last Year: Never true  Transportation Needs: No Transportation Needs (Mar 21, 2023)   PRAPARE - Therapist, art (Medical): No    Lack of Transportation (Non-Medical): No  Physical Activity: Sufficiently Active (03-21-2023)   Exercise Vital Sign    Days of Exercise per Week: 7 days    Minutes of Exercise per Session: 30 min  Stress: No Stress Concern Present (Mar 21, 2023)   [name] - Occupational Stress Questionnaire    Feeling of Stress : Not at all  Social Connections: Socially Integrated (03-21-2023)   Social Connection and Isolation Panel [NHANES]    Frequency of Communication with Friends and Family: More than three times a week    Frequency of Social Gatherings with Friends and Family: More than three times a week    Attends Religious Services: More than 4 times per year    Active Member of [employer] or Organizations: Yes    Attends Engineer, structural: More than 4 times per year    Marital Status: Married    Review of Systems Per HPI  Objective:  BP (!) 172/69   Pulse (!) 101   Temp 98.3 F (36.8 C)   Ht 5\' 8"  (1.727 m)   Wt 124 lb (56.2 kg)   SpO2 99%   BMI 18.85  kg/m      01/20/2024    8:44 AM 10/07/2023    3:05 PM 08/17/2023    2:26 PM  BP/Weight  Systolic BP 172 180 144  Diastolic BP 69 81 72  Wt. (Lbs) 124 124.2   BMI 18.85 kg/m2 18.88 kg/m2     Physical Exam Vitals and nursing note reviewed.  Constitutional:      General: She is not in acute distress.    Appearance: Normal appearance.  HENT:     Head: Normocephalic and atraumatic.     Right Ear: Tympanic membrane normal.     Left Ear: Tympanic membrane normal.     Nose: No congestion or rhinorrhea.  Eyes:     General:        Right eye: No discharge.        Left eye: No discharge.     Conjunctiva/sclera: Conjunctivae normal.  Cardiovascular:     Rate and Rhythm: Normal rate and regular rhythm.  Pulmonary:     Effort: Pulmonary effort is normal.     Breath sounds: Normal breath sounds. No wheezing or rales.  Neurological:     Mental Status: She is  alert.     Lab Results  Component Value Date   WBC 6.2 11/09/2022   HGB 15.0 11/09/2022   HCT 44.2 11/09/2022   PLT 167 11/09/2022   GLUCOSE 105 (H) 11/09/2022   CHOL 188 01/22/2021   TRIG 63 01/22/2021   HDL 68 01/22/2021   LDLCALC 108 (H) 01/22/2021   ALT 20 11/09/2022   AST 25 11/09/2022   NA 141 11/09/2022   K 3.7 11/09/2022   CL 105 11/09/2022   CREATININE 0.60 11/09/2022   BUN 12 11/09/2022   CO2 27 11/09/2022     Assessment & Plan:  Acute cough Assessment & Plan: Patient with recent rhinorrhea and now cough.  Allergic versus viral.  Continue Zyrtec .  Promethazine DM for cough.  Supportive care.   White coat syndrome with hypertension Assessment & Plan: Patient's BP elevated here today.  This is secondary to whitecoat hypertension.  Blood pressures are well-controlled at home.   Other orders -     Promethazine-DM; Take 5 mLs by mouth at bedtime as needed for cough.  Dispense: 118 mL; Refill: 0    Follow-up:  Return if symptoms worsen or fail to improve.  Kathleen Papa DO [facility]

## 2024-01-20 NOTE — Patient Instructions (Signed)
 Continue the Zyrtec .  Cough medication at night if needed.  If you worsen, please let me know.

## 2024-01-20 NOTE — Assessment & Plan Note (Signed)
 Patient with recent rhinorrhea and now cough.  Allergic versus viral.  Continue Zyrtec .  Promethazine DM for cough.  Supportive care.

## 2024-01-20 NOTE — Assessment & Plan Note (Signed)
 Patient's BP elevated here today.  This is secondary to whitecoat hypertension.  Blood pressures are well-controlled at home.

## 2024-02-16 ENCOUNTER — Encounter: Payer: Self-pay | Admitting: Family Medicine

## 2024-02-16 ENCOUNTER — Ambulatory Visit: Payer: Medicare Other | Admitting: Family Medicine

## 2024-02-16 VITALS — BP 140/78 | HR 99 | Temp 97.5°F | Ht 68.0 in | Wt 124.0 lb

## 2024-02-16 DIAGNOSIS — I10 Essential (primary) hypertension: Secondary | ICD-10-CM | POA: Diagnosis not present

## 2024-02-16 DIAGNOSIS — Z130 Encounter for screening for diseases of the blood and blood-forming organs and certain disorders involving the immune mechanism: Secondary | ICD-10-CM | POA: Diagnosis not present

## 2024-02-16 DIAGNOSIS — E7800 Pure hypercholesterolemia, unspecified: Secondary | ICD-10-CM

## 2024-02-16 MED ORDER — AMLODIPINE BESYLATE 2.5 MG PO TABS: 2.5000 mg | ORAL_TABLET | Freq: Every day | ORAL | 3 refills | Status: DC

## 2024-02-16 NOTE — Assessment & Plan Note (Signed)
 Stable. Continue Amlodipine . Rx sent.

## 2024-02-16 NOTE — Patient Instructions (Signed)
Continue your medication  Follow up in 6 months  

## 2024-02-16 NOTE — Progress Notes (Signed)
 Subjective:  Patient ID: Debra  GRACELAND George, female    DOB: 1941/06/13  Age: 83 y.o. MRN: [id number]  CC:   Chief Complaint  Patient presents with   Hypertension    Follow up    HPI:  83 year old female presents for follow up.  Patient overall doing well.  Continues to walk 2 miles a day.  Blood pressure well-controlled given age.  Compliant with amlodipine .  She is feeling well.  She has no complaints or concerns at this time.  Patient Active Problem List   Diagnosis Date Noted   Anxiety 09/22/2022   Osteoporosis 10/09/2021   Hyperlipidemia 03/25/2021   White coat syndrome with hypertension 02/04/2021    Social Hx   Social History   Socioeconomic History   Marital status: Married    Spouse name: Optometrist   Number of children: 1   Years of education: Not on file   Highest education level: Not on file  Occupational History   Not on file  Tobacco Use   Smoking status: Former   Smokeless tobacco: Never   Tobacco comments:    social smoker, quit 1990  Substance and Sexual Activity   Alcohol use: No    Alcohol/week: 0.0 standard drinks of alcohol   Drug use: No   Sexual activity: Not on file  Other Topics Concern   Not on file  Social History Narrative   Lives with husband [place].   Son also lives with patient.    Walks 2 miles per day, 5 days per week.       March 10, 2023 Husband passed away in Aug 12, 2022  Social Drivers of Health   Financial Resource Strain: Low Risk  (2023/03/10)   Overall Financial Resource Strain (CARDIA)    Difficulty of Paying Living Expenses: Not hard at all  Food Insecurity: No Food Insecurity (03/10/2023)   Hunger Vital Sign    Worried About Running Out of Food in the Last Year: Never true    Ran Out of Food in the Last Year: Never true  Transportation Needs: No Transportation Needs (03/10/23)   PRAPARE - Administrator, Civil Service (Medical): No    Lack of Transportation (Non-Medical): No  Physical Activity: Sufficiently Active  (10-Mar-2023)   Exercise Vital Sign    Days of Exercise per Week: 7 days    Minutes of Exercise per Session: 30 min  Stress: No Stress Concern Present (10-Mar-2023)   [name] - Occupational Stress Questionnaire    Feeling of Stress : Not at all  Social Connections: Socially Integrated (March 10, 2023)   Social Connection and Isolation Panel [NHANES]    Frequency of Communication with Friends and Family: More than three times a week    Frequency of Social Gatherings with Friends and Family: More than three times a week    Attends Religious Services: More than 4 times per year    Active Member of [employer] or Organizations: Yes    Attends Engineer, structural: More than 4 times per year    Marital Status: Married    Review of Systems Per HPI  Objective:  BP (!) 140/78   Pulse 99   Temp (!) 97.5 F (36.4 C)   Ht 5\' 8"  (1.727 m)   Wt 124 lb (56.2 kg)   SpO2 98%   BMI 18.85 kg/m      02/16/2024   11:44 AM 02/16/2024   11:18 AM 01/20/2024    8:44 AM  BP/Weight  Systolic BP 140 158 172  Diastolic BP 78 84 69  Wt. (Lbs)  124 124  BMI  18.85 kg/m2 18.85 kg/m2    Physical Exam Vitals and nursing note reviewed.  Constitutional:      General: She is not in acute distress.    Appearance: Normal appearance.  HENT:     Head: Normocephalic and atraumatic.  Eyes:     General:        Right eye: No discharge.        Left eye: No discharge.     Conjunctiva/sclera: Conjunctivae normal.  Cardiovascular:     Rate and Rhythm: Normal rate and regular rhythm.  Pulmonary:     Effort: Pulmonary effort is normal.     Breath sounds: Normal breath sounds. No wheezing, rhonchi or rales.  Neurological:     Mental Status: She is alert.  Psychiatric:        Mood and Affect: Mood normal.        Behavior: Behavior normal.     Lab Results  Component Value Date   WBC 6.2 11/09/2022   HGB 15.0 11/09/2022   HCT 44.2 11/09/2022   PLT 167 11/09/2022   GLUCOSE 105  (H) 11/09/2022   CHOL 188 01/22/2021   TRIG 63 01/22/2021   HDL 68 01/22/2021   LDLCALC 108 (H) 01/22/2021   ALT 20 11/09/2022   AST 25 11/09/2022   NA 141 11/09/2022   K 3.7 11/09/2022   CL 105 11/09/2022   CREATININE 0.60 11/09/2022   BUN 12 11/09/2022   CO2 27 11/09/2022     Assessment & Plan:  White coat syndrome with hypertension Assessment & Plan: Stable. Continue Amlodipine . Rx sent.  Orders: -     CMP14+EGFR -     amLODIPine  Besylate; Take 1 tablet (2.5 mg total) by mouth daily.  Dispense: 90 tablet; Refill: 3  Pure hypercholesterolemia -     Lipid panel  Screening for deficiency anemia -     CBC    Follow-up:  6 months  Allannah Kempen [name] DO [facility]

## 2024-02-17 DIAGNOSIS — Z130 Encounter for screening for diseases of the blood and blood-forming organs and certain disorders involving the immune mechanism: Secondary | ICD-10-CM | POA: Diagnosis not present

## 2024-02-17 DIAGNOSIS — I10 Essential (primary) hypertension: Secondary | ICD-10-CM | POA: Diagnosis not present

## 2024-02-17 DIAGNOSIS — E7800 Pure hypercholesterolemia, unspecified: Secondary | ICD-10-CM | POA: Diagnosis not present

## 2024-02-18 ENCOUNTER — Ambulatory Visit: Payer: Self-pay | Admitting: Family Medicine

## 2024-02-18 LAB — CBC
Hematocrit: 41.5 % (ref 34.0–46.6)
Hemoglobin: 13.9 g/dL (ref 11.1–15.9)
MCH: 32.3 pg (ref 26.6–33.0)
MCHC: 33.5 g/dL (ref 31.5–35.7)
MCV: 97 fL (ref 79–97)
Platelets: 162 10*3/uL (ref 150–450)
RBC: 4.3 x10E6/uL (ref 3.77–5.28)
RDW: 13.2 % (ref 11.7–15.4)
WBC: 7.2 10*3/uL (ref 3.4–10.8)

## 2024-02-18 LAB — CMP14+EGFR
ALT: 19 IU/L (ref 0–32)
AST: 26 IU/L (ref 0–40)
Albumin: 4.5 g/dL (ref 3.7–4.7)
Alkaline Phosphatase: 68 IU/L (ref 44–121)
BUN/Creatinine Ratio: 18 (ref 12–28)
BUN: 14 mg/dL (ref 8–27)
Bilirubin Total: 0.7 mg/dL (ref 0.0–1.2)
CO2: 23 mmol/L (ref 20–29)
Calcium: 9.3 mg/dL (ref 8.7–10.3)
Chloride: 102 mmol/L (ref 96–106)
Creatinine, Ser: 0.76 mg/dL (ref 0.57–1.00)
Globulin, Total: 1.8 g/dL (ref 1.5–4.5)
Glucose: 90 mg/dL (ref 70–99)
Potassium: 4.3 mmol/L (ref 3.5–5.2)
Sodium: 141 mmol/L (ref 134–144)
Total Protein: 6.3 g/dL (ref 6.0–8.5)
eGFR: 78 mL/min/{1.73_m2} (ref >59)

## 2024-02-18 LAB — LIPID PANEL
Chol/HDL Ratio: 2.4 ratio (ref 0.0–4.4)
Cholesterol, Total: 185 mg/dL (ref 100–199)
HDL: 76 mg/dL (ref >39)
LDL Chol Calc (NIH): 94 mg/dL (ref 0–99)
Triglycerides: 85 mg/dL (ref 0–149)
VLDL Cholesterol Cal: 15 mg/dL (ref 5–40)

## 2024-02-26 ENCOUNTER — Ambulatory Visit (INDEPENDENT_AMBULATORY_CARE_PROVIDER_SITE_OTHER): Payer: Medicare Other

## 2024-02-26 VITALS — Ht 68.0 in | Wt 124.0 lb

## 2024-02-26 DIAGNOSIS — Z0000 Encounter for general adult medical examination without abnormal findings: Secondary | ICD-10-CM

## 2024-02-26 NOTE — Progress Notes (Signed)
 Subjective:   Debra George is a 83 y.o. who presents for a Medicare Wellness preventive visit.  As a reminder, Annual Wellness Visits don't include a physical exam, and some assessments may be limited, especially if this visit is performed virtually. We may recommend an in-person follow-up visit with your provider if needed.  Visit Complete: Virtual I connected with  Debra George on 02/26/24 by a audio enabled telemedicine application and verified that I am speaking with the correct person using two identifiers.  Patient Location: Home  Provider Location: Home Office  I discussed the limitations of evaluation and management by telemedicine. The patient expressed understanding and agreed to proceed.  Vital Signs: Because this visit was a virtual/telehealth visit, some criteria may be missing or patient reported. Any vitals not documented were not able to be obtained and vitals that have been documented are patient reported.  VideoDeclined- This patient declined Librarian, academic. Therefore the visit was completed with audio only.  Persons Participating in Visit: Patient.  AWV Questionnaire: No: Patient Medicare AWV questionnaire was not completed prior to this visit.  Cardiac Risk Factors include: advanced age (>71men, >15 women)     Objective:    Today's Vitals   02/26/24 1006  Weight: 124 lb (56.2 kg)  Height: 5' 8 (1.727 m)   Body mass index is 18.85 kg/m.     02/26/2024   10:11 AM 02/20/2023   10:30 AM 11/09/2022    7:31 AM 02/11/2022   10:23 AM 06/15/2019    6:37 AM 12/11/2015    6:42 AM 11/20/2015    1:55 PM  Advanced Directives  Does Patient Have a Medical Advance Directive? No No No Yes No No  No   Type of Theme park manager;Living will     Copy of Healthcare Power of Attorney in Chart?    No - copy requested     Would patient like information on creating a medical advance directive? Yes  (MAU/Ambulatory/Procedural Areas - Information given) No - Patient declined No - Patient declined  No - Patient declined No - patient declined information  No - patient declined information      Data saved with a previous flowsheet row definition    Current Medications (verified) Outpatient Encounter Medications as of 02/26/2024  Medication Sig   amLODipine  (NORVASC ) 2.5 MG tablet Take 1 tablet (2.5 mg total) by mouth daily.   calcium-vitamin D (OSCAL WITH D) 500-200 MG-UNIT per tablet Take 1 tablet by mouth 2 (two) times daily.   cetirizine  (ZYRTEC ) 5 MG tablet Take 1 tablet (5 mg total) by mouth daily.   ipratropium (ATROVENT ) 0.06 % nasal spray Place 2 sprays into both nostrils 4 (four) times daily as needed for rhinitis.   Multiple Vitamins-Minerals (PRESERVISION AREDS 2 PO) Take 1 tablet by mouth 2 (two) times daily.   No facility-administered encounter medications on file as of 02/26/2024.    Allergies (verified) Pneumococcal polysaccharide vaccine   History: Past Medical History:  Diagnosis Date   Macular degeneration    Past Surgical History:  Procedure Laterality Date   APPENDECTOMY     CATARACT EXTRACTION W/PHACO Left 11/27/2015   Procedure: CATARACT EXTRACTION PHACO AND INTRAOCULAR LENS PLACEMENT (IOC);  Surgeon: Albert Huff, MD;  Location: [facility];  Service: Ophthalmology;  Laterality: Left;  CDE:10.0   CATARACT EXTRACTION W/PHACO Right 12/11/2015   Procedure: CATARACT EXTRACTION PHACO AND INTRAOCULAR LENS PLACEMENT (IOC);  Surgeon: Albert Huff, MD;  Location: [facility];  Service: Ophthalmology;  Laterality: Right;  CDE:8.61   CESAREAN SECTION     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   COLONOSCOPY  09/03/2005   RMR: normal rectum/dimintive polyp at the hepatic flexure cold bx   COLONOSCOPY  2012   adenomatous appearing anastomosis but unremarkable biopsy   COLONOSCOPY N/A 10/11/2015   Procedure: COLONOSCOPY;  Surgeon: Suzette Espy, MD;  Location: [facility];   Service: Endoscopy;  Laterality: N/A;  200   COLONOSCOPY N/A 06/15/2019   Procedure: COLONOSCOPY;  Surgeon: Suzette Espy, MD;  Location: [facility];  Service: Endoscopy;  Laterality: N/A;  8:45   POLYPECTOMY  06/15/2019   Procedure: POLYPECTOMY;  Surgeon: Suzette Espy, MD;  Location: [facility];  Service: Endoscopy;;  splenic flexure   TUBAL LIGATION     Family History  Problem Relation Age of Onset   Congestive Heart Failure Mother    Heart Problems Father    COPD Brother    Colon cancer Neg Hx    Breast cancer Neg Hx    Social History   Socioeconomic History   Marital status: Married    Spouse name: Optometrist   Number of children: 1   Years of education: Not on file   Highest education level: Not on file  Occupational History   Not on file  Tobacco Use   Smoking status: Former   Smokeless tobacco: Never   Tobacco comments:    social smoker, quit 1990  Substance and Sexual Activity   Alcohol use: No    Alcohol/week: 0.0 standard drinks of alcohol   Drug use: No   Sexual activity: Not on file  Other Topics Concern   Not on file  Social History Narrative   Lives with husband [place].   Son also lives with patient.    Walks 2 miles per day, 5 days per week.       03/15/23 Husband passed away in 08-29-22  Social Drivers of Health   Financial Resource Strain: Low Risk  (02/26/2024)   Overall Financial Resource Strain (CARDIA)    Difficulty of Paying Living Expenses: Not hard at all  Food Insecurity: No Food Insecurity (02/26/2024)   Hunger Vital Sign    Worried About Running Out of Food in the Last Year: Never true    Ran Out of Food in the Last Year: Never true  Transportation Needs: No Transportation Needs (02/26/2024)   PRAPARE - Administrator, Civil Service (Medical): No    Lack of Transportation (Non-Medical): No  Physical Activity: Sufficiently Active (02/26/2024)   Exercise Vital Sign    Days of Exercise per Week: 7 days    Minutes of Exercise  per Session: 30 min  Stress: No Stress Concern Present (02/26/2024)   [name] - Occupational Stress Questionnaire    Feeling of Stress: Not at all  Social Connections: Socially Integrated (02/26/2024)   Social Connection and Isolation Panel    Frequency of Communication with Friends and Family: More than three times a week    Frequency of Social Gatherings with Friends and Family: More than three times a week    Attends Religious Services: More than 4 times per year    Active Member of [employer] or Organizations: Yes    Attends Engineer, structural: More than 4 times per year    Marital Status: Married    Tobacco Counseling Counseling given: Not  Answered Tobacco comments: social smoker, quit 1990    Clinical Intake:  Pre-visit preparation completed: Yes  Pain : No/denies pain  Diabetes: No   How often do you need to have someone help you when you read instructions, pamphlets, or other written materials from your doctor or pharmacy?: 1 - Never  Interpreter Needed?: No  Information entered by :: Seabron Cypress LPN   Activities of Daily Living     02/26/2024   10:11 AM  In your present state of health, do you have any difficulty performing the following activities:  Hearing? 0  Vision? 0  Difficulty concentrating or making decisions? 0  Walking or climbing stairs? 0  Dressing or bathing? 0  Doing errands, shopping? 0  Preparing Food and eating ? N  Using the Toilet? N  In the past six months, have you accidently leaked urine? N  Do you have problems with loss of bowel control? N  Managing your Medications? N  Managing your Finances? N  Housekeeping or managing your Housekeeping? N    Patient Care Team: Cook, Jayce G, DO as PCP - General (Family Medicine) Riley Cheadle, Windsor Hatcher, MD as Consulting Physician (Gastroenterology) Devon Fogo, MD (Inactive) as Consulting Physician (Dermatology) [facility]  I have  updated your Care Teams any recent Medical Services you may have received from other providers in the past year.     Assessment:   This is a routine wellness examination for Adrain .  Hearing/Vision screen Hearing Screening - Comments:: Denies hearing difficulties   Vision Screening - Comments:: Wears rx glasses - up to date with routine eye exams with [facility]    Goals Addressed             This Visit's Progress    Patient Stated   On track    Patient states her goal is to remain as active and independent as she currently is.        Depression Screen     02/26/2024   10:12 AM 02/16/2024   11:20 AM 01/20/2024    9:00 AM 10/07/2023    3:11 PM 08/17/2023    2:09 PM 07/15/2023    2:23 PM 06/30/2023    9:52 AM  PHQ 2/9 Scores  PHQ - 2 Score 0 0 0 0 0 0 0  PHQ- 9 Score 0 0 0 0 0 0 0    Fall Risk     02/26/2024   10:11 AM 02/16/2024   11:20 AM 10/07/2023    3:11 PM 08/17/2023    2:09 PM 07/15/2023    2:23 PM  Fall Risk   Falls in the past year? 0 0 0 1 0  Number falls in past yr: 0 0  0 0  Injury with Fall? 0 0  0 0  Risk for fall due to : No Fall Risks No Fall Risks  History of fall(s)   Follow up Falls prevention discussed;Education provided;Falls evaluation completed Falls evaluation completed  Falls evaluation completed     MEDICARE RISK AT HOME:  Medicare Risk at Home Any stairs in or around the home?: No If so, are there any without handrails?: No Home free of loose throw rugs in walkways, pet beds, electrical cords, etc?: Yes Adequate lighting in your home to reduce risk of falls?: Yes Life alert?: No Use of a cane, walker or w/c?: No Grab bars in the bathroom?: Yes Shower chair or bench in shower?: No Elevated toilet seat or a handicapped  toilet?: Yes  TIMED UP AND GO:  Was the test performed?  No  Cognitive Function: 6CIT completed        02/26/2024   10:11 AM 02/20/2023   10:31 AM 02/11/2022   10:29 AM  6CIT Screen  What Year? 0 points 0  points 0 points  What month? 0 points 0 points 0 points  What time? 0 points 0 points 0 points  Count back from 20 0 points 0 points 0 points  Months in reverse 0 points 0 points 0 points  Repeat phrase 0 points 0 points 0 points  Total Score 0 points 0 points 0 points    Immunizations Immunization History  Administered Date(s) Administered   Fluad Quad(high Dose 65+) 06/11/2022   Fluad Trivalent(High Dose 65+) 06/17/2023   Influenza,inj,Quad PF,6+ Mos 06/27/2015, 07/14/2016, 06/30/2017, 07/01/2018, 06/18/2019   Influenza-Unspecified 07/16/2013, 06/18/2020, 06/24/2021   Moderna SARS-COV2 Booster Vaccination 10/17/2020, 02/06/2022   Moderna Sars-Covid-2 Vaccination 10/25/2019, 07/17/2020, 07/18/2021   Pneumococcal Conjugate-13 09/05/2014   Pneumococcal Polysaccharide-23 09/21/2018   Tdap 02/13/2023    Screening Tests Health Maintenance  Topic Date Due   Zoster Vaccines- Shingrix (1 of 2) Never done   INFLUENZA VACCINE  04/15/2024   Medicare Annual Wellness (AWV)  02/25/2025   DTaP/Tdap/Td (2 - Td or Tdap) 02/12/2033   Pneumococcal Vaccine: 50+ Years  Completed   DEXA SCAN  Completed   HPV VACCINES  Aged Out   Meningococcal B Vaccine  Aged Out   COVID-19 Vaccine  Discontinued    Health Maintenance  Health Maintenance Due  Topic Date Due   Zoster Vaccines- Shingrix (1 of 2) Never done    Additional Screening:  Vision Screening: Recommended annual ophthalmology exams for early detection of glaucoma and other disorders of the eye. Would you like a referral to an eye doctor? No    Dental Screening: Recommended annual dental exams for proper oral hygiene  Community Resource Referral / Chronic Care Management: CRR required this visit?  No   CCM required this visit?  No   Plan:    I have personally reviewed and noted the following in the patient's chart:   Medical and social history Use of alcohol, tobacco or illicit drugs  Current medications and supplements  including opioid prescriptions. Patient is not currently taking opioid prescriptions. Functional ability and status Nutritional status Physical activity Advanced directives List of other physicians Hospitalizations, surgeries, and ER visits in previous 12 months Vitals Screenings to include cognitive, depression, and falls Referrals and appointments  In addition, I have reviewed and discussed with patient certain preventive protocols, quality metrics, and best practice recommendations. A written personalized care plan for preventive services as well as general preventive health recommendations were provided to patient.   Seabron Cypress [place], [place]   1/61/0960   After Visit Summary: (Declined) Due to this being a telephonic visit, with patients personalized plan was offered to patient but patient Declined AVS at this time   Notes: Nothing significant to report at this time.

## 2024-02-26 NOTE — Patient Instructions (Addendum)
 Debra George , Thank you for taking time out of your busy schedule to complete your Annual Wellness Visit with me. I enjoyed our conversation and look forward to speaking with you again next year. I, as well as your care team,  appreciate your ongoing commitment to your health goals. Please review the following plan we discussed and let me know if I can assist you in the future. Your Game plan/ To Do List    Follow up Visits: Next Medicare AWV with our clinical staff: In 1 year    Have you seen your provider in the last 6 months (3 months if uncontrolled diabetes)? Yes Next Office Visit with your provider: 08/17/24 @ 11:00  Clinician Recommendations:  Aim for 30 minutes of exercise or brisk walking, 6-8 glasses of water , and 5 servings of fruits and vegetables each day.       This is a list of the screening recommended for you and due dates:  Health Maintenance  Topic Date Due   Zoster (Shingles) Vaccine (1 of 2) Never done   Flu Shot  04/15/2024   Medicare Annual Wellness Visit  02/25/2025   DTaP/Tdap/Td vaccine (2 - Td or Tdap) 02/12/2033   Pneumococcal Vaccine for age over 75  Completed   DEXA scan (bone density measurement)  Completed   HPV Vaccine  Aged Out   Meningitis B Vaccine  Aged Out   COVID-19 Vaccine  Discontinued    Advanced directives: (ACP Link)Information on Advanced Care Planning can be found at [place]  Secretary of [facility] Advance Health Care Directives Advance Health Care Directives. [email or web address]   Advance Care Planning is important because it:  [x]  Makes sure you receive the medical care that is consistent with your values, goals, and preferences  [x]  It provides guidance to your family and loved ones and reduces their decisional burden about whether or not they are making the right decisions based on your wishes.  Follow the link provided in your after visit summary or read over the paperwork we have mailed to you to help you started getting your Advance  Directives in place. If you need assistance in completing these, please reach out to us  so that we can help you!  See attachments for Preventive Care and Fall Prevention Tips.

## 2024-04-19 ENCOUNTER — Ambulatory Visit: Admitting: Podiatry

## 2024-04-21 ENCOUNTER — Ambulatory Visit: Admitting: Podiatry

## 2024-04-21 ENCOUNTER — Encounter: Payer: Self-pay | Admitting: Podiatry

## 2024-04-21 VITALS — Ht 68.0 in | Wt 124.0 lb

## 2024-04-21 DIAGNOSIS — M2012 Hallux valgus (acquired), left foot: Secondary | ICD-10-CM | POA: Diagnosis not present

## 2024-04-21 DIAGNOSIS — M21611 Bunion of right foot: Secondary | ICD-10-CM

## 2024-04-21 DIAGNOSIS — M21612 Bunion of left foot: Secondary | ICD-10-CM

## 2024-04-21 DIAGNOSIS — M19072 Primary osteoarthritis, left ankle and foot: Secondary | ICD-10-CM

## 2024-04-21 DIAGNOSIS — M2011 Hallux valgus (acquired), right foot: Secondary | ICD-10-CM

## 2024-04-21 DIAGNOSIS — G609 Hereditary and idiopathic neuropathy, unspecified: Secondary | ICD-10-CM

## 2024-04-21 NOTE — Progress Notes (Signed)
  Subjective:  Patient ID: Debra George  JILLEEN ESSNER, female    DOB: January 13, 1941,  MRN: [id number]  Chief Complaint  Patient presents with   Foot Problem    Rm 20 Patient is here for tight sensation in both feet at night. Patient states no pain in feet.    Discussed the use of AI scribe software for clinical note transcription with the patient, who gave verbal consent to proceed.  History of Present Illness Debra  H George is an 83 year old female who presents with tightness in her feet at night.  She experiences a sensation of tightness in her feet at night, described as 'something pulls on them', without associated pain. This occurs after removing her shoes and walking across the floor, resolving when she puts on her bedroom shoes. There are no limitations in her walking ability.  She maintains a long-standing habit of walking two miles a day, initially seven days a week, now reduced to five days a week due to aging. This routine began in 1980 after a recommendation to avoid surgery for a ruptured disc.  No history of diabetes, and she believes she has good circulation.  She wears cotton on her toe due to discomfort. She reports no pain from her foot.      Objective:    Physical Exam VASCULAR: DP and PT pulse palpable. Foot is warm and well-perfused. Capillary fill time is brisk. Minor varicosities present. No edema. DERMATOLOGIC: Normal skin turgor texture and temperature. No open lesions or rashes or ulcerations. NEUROLOGIC: Normal sensation to light touch and pressure. No paresthesias on examination. No evidence of tarsal tunnel syndrome. ORTHOPEDIC: Smooth pain-free range of motion of all examined joints. No ecchymosis or bruising. No gross deformity. No pain to palpation.   No images are attached to the encounter.    Results     Assessment:   1. Idiopathic peripheral neuropathy   2. Hallux valgus with bunions, right   3. Hallux valgus with bunions, left   4.  Arthritis of left foot      Plan:  Patient was evaluated and treated and all questions answered.  Assessment and Plan Assessment & Plan Early neuropathy of feet Experiencing early neuropathy in the feet, likely due to age-related changes in nerve function. Symptoms include tightness at night without pain, indicating mild severity. Good circulation and no diabetes present. Neuropathy is not limiting her walking ability. No cure or fix available, but supportive measures can help manage symptoms. 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 No pain reported, and the condition is not affecting mobility. - Advise using a pad or spacer between toes to prevent rubbing  Minor varicosities of lower extremities Minor varicosities noted in the lower extremities. No edema or pain present, and circulation is good.      Return if symptoms worsen or fail to improve.

## 2024-04-21 NOTE — Patient Instructions (Signed)
  VISIT SUMMARY: Today, we discussed the tightness you feel in your feet at night. We also reviewed your arthritis in the right toe joint and minor varicose veins in your lower legs. Your walking routine and overall circulation are good, and there are no major concerns affecting your mobility.  YOUR PLAN: -EARLY NEUROPATHY OF FEET: Early neuropathy means that the nerves in your feet are starting to show signs of age-related changes, causing a sensation of tightness at night. To help manage this, you should wear supportive bedroom shoes and stretch your ankles and toes before walking.  -ARTHRITIS OF RIGHT TOE JOINT WITH BONE SPUR AND BUNION: Arthritis in your right toe joint, along with a bone spur and bunion, means that the joint is inflamed and has extra bone growth. Although you are not experiencing pain, using a pad or spacer between your toes can help prevent rubbing.  -MINOR VARICOSITIES OF LOWER EXTREMITIES: Minor varicosities are small, visible veins in your lower legs. They are not causing pain or swelling, and your circulation is good, so no treatment is needed at this time.  INSTRUCTIONS: Please continue your walking routine as it is beneficial for your overall health. Follow the advice given for managing the tightness in your feet and the arthritis in your toe. If you experience any new symptoms or changes, please schedule a follow-up appointment.                      Contains text generated by Abridge.                                 Contains text generated by Abridge.

## 2024-05-24 ENCOUNTER — Ambulatory Visit (INDEPENDENT_AMBULATORY_CARE_PROVIDER_SITE_OTHER): Admitting: Podiatry

## 2024-05-24 VITALS — Ht 68.0 in | Wt 124.0 lb

## 2024-05-24 DIAGNOSIS — B351 Tinea unguium: Secondary | ICD-10-CM | POA: Diagnosis not present

## 2024-05-24 DIAGNOSIS — L609 Nail disorder, unspecified: Secondary | ICD-10-CM | POA: Diagnosis not present

## 2024-05-24 MED ORDER — CICLOPIROX 8 % EX SOLN: Freq: Every day | CUTANEOUS | 6 refills | Status: DC

## 2024-05-25 NOTE — Progress Notes (Signed)
  Subjective:  Patient ID: Debra  SHERIL George, female    DOB: Feb 18, 1941,  MRN: [id number]  Chief Complaint  Patient presents with   Nail Problem    RM 4 Pt is here for discolored right hallux nail. Discoloration present x 1 week. Pt states no injury to the toe or pain/discomfort.    83 y.o. female presents with the above complaint. History confirmed with patient.  She returns today with a new issue of discoloration of right hallux nail thought maybe it was a bruise or an injury but does not recall a specific event  Objective:  Physical Exam: warm, good capillary refill, normal DP and PT pulses,and discoloration distal right hallux lateral border consistent with onychomycosis. Assessment:   1. Onychomycosis      Plan:  Patient was evaluated and treated and all questions answered.  We discussed treatment option including oral and topical therapy.  I recommended topical therapy and Rx for Penlac  was sent to pharmacy.  We also took culture of the right hallux lateral nail plate.  We discussed its use and administration.  Return if symptoms worsen or fail to improve.

## 2024-06-02 ENCOUNTER — Other Ambulatory Visit: Payer: Self-pay | Admitting: Podiatry

## 2024-06-06 DIAGNOSIS — H353131 Nonexudative age-related macular degeneration, bilateral, early dry stage: Secondary | ICD-10-CM | POA: Diagnosis not present

## 2024-06-06 DIAGNOSIS — Z961 Presence of intraocular lens: Secondary | ICD-10-CM | POA: Diagnosis not present

## 2024-06-06 DIAGNOSIS — H04123 Dry eye syndrome of bilateral lacrimal glands: Secondary | ICD-10-CM | POA: Diagnosis not present

## 2024-06-15 ENCOUNTER — Telehealth: Payer: Self-pay

## 2024-06-15 NOTE — Telephone Encounter (Signed)
 Pt is requesting a RX for [employer] sent to [facility]

## 2024-06-20 ENCOUNTER — Other Ambulatory Visit: Payer: Self-pay

## 2024-06-20 DIAGNOSIS — Z23 Encounter for immunization: Secondary | ICD-10-CM

## 2024-06-20 MED ORDER — COVID-19 MRNA VAC-TRIS(PFIZER) 30 MCG/0.3ML IM SUSY: 0.3000 mL | PREFILLED_SYRINGE | Freq: Once | INTRAMUSCULAR | 0 refills | Status: AC

## 2024-06-20 NOTE — Telephone Encounter (Signed)
 Orders placed and and pt informed .

## 2024-08-17 ENCOUNTER — Ambulatory Visit: Admitting: Family Medicine

## 2024-08-17 ENCOUNTER — Encounter: Payer: Self-pay | Admitting: Family Medicine

## 2024-08-17 VITALS — BP 170/88 | HR 108 | Ht 68.0 in | Wt 124.0 lb

## 2024-08-17 DIAGNOSIS — I10 Essential (primary) hypertension: Secondary | ICD-10-CM

## 2024-08-17 NOTE — Progress Notes (Signed)
 Subjective:  Patient ID: Debra  Debra George, female    DOB: 1940/11/12  Age: 83 y.o. MRN: [id number]  CC:   Chief Complaint  Patient presents with   Hypertension    Six month follow up     HPI:  83 year old female presents for follow up.  Doing well. Feeling well.   BP stable at home.   No complaints or concerns at this time.  Patient Active Problem List   Diagnosis Date Noted   Anxiety 09/22/2022   Osteoporosis 10/09/2021   Hyperlipidemia 03/25/2021   White coat syndrome with hypertension 02/04/2021    Social Hx   Social History   Socioeconomic History   Marital status: Married    Spouse name: Debra George   Number of children: 1   Years of education: Not on file   Highest education level: Not on file  Occupational History   Not on file  Tobacco Use   Smoking status: Former   Smokeless tobacco: Never   Tobacco comments:    social smoker, quit 1990  Substance and Sexual Activity   Alcohol use: No    Alcohol/week: 0.0 standard drinks of alcohol   Drug use: No   Sexual activity: Not on file  Other Topics Concern   Not on file  Social History Narrative   Lives with husband Debra George.   Son also lives with patient.    Walks 2 miles per day, 5 days per week.       03/01/23 Husband passed away in July 30, 2022  Social Drivers of George   Financial Resource Strain: Low Risk  (02/26/2024)   Overall Financial Resource Strain (CARDIA)    Difficulty of Paying Living Expenses: Not hard at all  Food Insecurity: No Food Insecurity (02/26/2024)   Hunger Vital Sign    Worried About Running Out of Food in the Last Year: Never true    Ran Out of Food in the Last Year: Never true  Transportation Needs: No Transportation Needs (02/26/2024)   PRAPARE - Administrator, Civil Service (Medical): No    Lack of Transportation (Non-Medical): No  Physical Activity: Sufficiently Active (02/26/2024)   Exercise Vital Sign    Days of Exercise per Week: 7 days    Minutes of Exercise  per Session: 30 min  Stress: No Stress Concern Present (02/26/2024)   Debra George - Occupational Stress Questionnaire    Feeling of Stress: Not at all  Social Connections: Socially Integrated (02/26/2024)   Social Connection and Isolation Panel    Frequency of Communication with Friends and Family: More than three times a week    Frequency of Social Gatherings with Friends and Family: More than three times a week    Attends Religious Services: More than 4 times per year    Active Member of [employer] or Organizations: Yes    Attends Engineer, Structural: More than 4 times per year    Marital Status: Married    Review of Systems Per HPI  Objective:  BP (!) 170/88   Pulse (!) 108   Ht 5' 8 (1.727 m)   Wt 124 lb (56.2 kg)   SpO2 96%   BMI 18.85 kg/m      08/17/2024   11:22 AM 08/17/2024   11:10 AM 05/24/2024    1:38 PM  BP/Weight  Systolic BP 170 194   Diastolic BP 88 66   Wt. (Lbs)  124 124  BMI  18.85  kg/m2 18.85 kg/m2    Physical Exam Vitals and nursing note reviewed.  Constitutional:      General: She is not in acute distress.    Appearance: Normal appearance.  HENT:     Head: Normocephalic and atraumatic.  Eyes:     General:        Right eye: No discharge.        Left eye: No discharge.     Conjunctiva/sclera: Conjunctivae normal.  Cardiovascular:     Rate and Rhythm: Regular rhythm. Tachycardia present.  Pulmonary:     Effort: Pulmonary effort is normal.     Breath sounds: Normal breath sounds. No wheezing, rhonchi or rales.  Neurological:     Mental Status: She is alert.  Psychiatric:        Mood and Affect: Mood normal.        Behavior: Behavior normal.     Lab Results  Component Value Date   WBC 7.2 02/17/2024   HGB 13.9 02/17/2024   HCT 41.5 02/17/2024   PLT 162 02/17/2024   GLUCOSE 90 02/17/2024   CHOL 185 02/17/2024   TRIG 85 02/17/2024   HDL 76 02/17/2024   LDLCALC 94 02/17/2024   ALT 19 02/17/2024   AST  26 02/17/2024   NA 141 02/17/2024   K 4.3 02/17/2024   CL 102 02/17/2024   CREATININE 0.76 02/17/2024   BUN 14 02/17/2024   CO2 23 02/17/2024     Assessment & Plan:  White coat syndrome with hypertension Assessment & Plan: Stable via home readings.  Continue Amlodipine .     Follow-up:  6 months  Rushie Brazel Bluford DO [facility]

## 2024-08-17 NOTE — Patient Instructions (Signed)
 Watch BP at home.  Follow up in 6 months.

## 2024-08-17 NOTE — Assessment & Plan Note (Signed)
 Stable via home readings.  Continue Amlodipine .

## 2024-09-09 ENCOUNTER — Emergency Department (HOSPITAL_COMMUNITY)

## 2024-09-09 ENCOUNTER — Emergency Department (HOSPITAL_COMMUNITY)
Admission: EM | Admit: 2024-09-09 | Discharge: 2024-09-09 | Disposition: A | Discharge status: Home / Self Care | Attending: Emergency Medicine | Admitting: Emergency Medicine

## 2024-09-09 ENCOUNTER — Encounter (HOSPITAL_COMMUNITY): Payer: Self-pay

## 2024-09-09 ENCOUNTER — Other Ambulatory Visit: Payer: Self-pay

## 2024-09-09 ENCOUNTER — Ambulatory Visit: Payer: Self-pay

## 2024-09-09 DIAGNOSIS — R42 Dizziness and giddiness: Secondary | ICD-10-CM | POA: Insufficient documentation

## 2024-09-09 LAB — COMPREHENSIVE METABOLIC PANEL WITH GFR
ALT: 20 U/L (ref 0–44)
AST: 27 U/L (ref 15–41)
Albumin: 4.2 g/dL (ref 3.5–5.0)
Alkaline Phosphatase: 78 U/L (ref 38–126)
Anion gap: 8 (ref 5–15)
BUN: 11 mg/dL (ref 8–23)
CO2: 30 mmol/L (ref 22–32)
Calcium: 9.2 mg/dL (ref 8.9–10.3)
Chloride: 103 mmol/L (ref 98–111)
Creatinine, Ser: 0.6 mg/dL (ref 0.44–1.00)
GFR, Estimated: >60 mL/min
Glucose, Bld: 104 mg/dL — ABNORMAL HIGH (ref 70–99)
Potassium: 3.8 mmol/L (ref 3.5–5.1)
Sodium: 141 mmol/L (ref 135–145)
Total Bilirubin: 0.4 mg/dL (ref 0.0–1.2)
Total Protein: 6.3 g/dL — ABNORMAL LOW (ref 6.5–8.1)

## 2024-09-09 LAB — URINALYSIS, ROUTINE W REFLEX MICROSCOPIC
Bilirubin Urine: NEGATIVE
Glucose, UA: NEGATIVE mg/dL
Hgb urine dipstick: NEGATIVE
Ketones, ur: NEGATIVE mg/dL
Leukocytes,Ua: NEGATIVE
Nitrite: NEGATIVE
Protein, ur: NEGATIVE mg/dL
Specific Gravity, Urine: 1.001 — ABNORMAL LOW (ref 1.005–1.030)
pH: 7 (ref 5.0–8.0)

## 2024-09-09 LAB — CBC
HCT: 38.3 % (ref 36.0–46.0)
Hemoglobin: 12.7 g/dL (ref 12.0–15.0)
MCH: 30.8 pg (ref 26.0–34.0)
MCHC: 33.2 g/dL (ref 30.0–36.0)
MCV: 93 fL (ref 80.0–100.0)
Platelets: 178 K/uL (ref 150–400)
RBC: 4.12 MIL/uL (ref 3.87–5.11)
RDW: 13.4 % (ref 11.5–15.5)
WBC: 6.4 K/uL (ref 4.0–10.5)
nRBC: 0 % (ref 0.0–0.2)

## 2024-09-09 LAB — CBG MONITORING, ED: Glucose-Capillary: 116 mg/dL — ABNORMAL HIGH (ref 70–99)

## 2024-09-09 MED ORDER — MECLIZINE HCL 12.5 MG PO TABS: 12.5000 mg | ORAL_TABLET | Freq: Three times a day (TID) | ORAL | 0 refills | Status: AC | PRN

## 2024-09-09 NOTE — ED Notes (Signed)
 Pt/family received d/c paperwork at this time. After going over the paperwork any questions, comments, or concerns were answered to the best of this nurse's knowledge. The pt/family verbally acknowledged the teachings/instructions.

## 2024-09-09 NOTE — ED Triage Notes (Signed)
 Pt reports that she went to the hair dresser and got dizzy getting into her car. Pt reports her elevated BP reading after this episode. Pt states she has bad nerves. Pt also reports similar symptoms with inner ear problems. Pt denies dizziness during triage.

## 2024-09-09 NOTE — Telephone Encounter (Signed)
 FYI Only or Action Required?: FYI only for provider: ED is advised.  Patient was last seen in primary care on 08/17/2024 by Cook, Jayce G, DO.  Called Nurse Triage reporting Dizziness.  Symptoms began today.  Interventions attempted: Rest, hydration, or home remedies.  Symptoms are: blood pressure has gone back up ---pt states severe dizziness when she lays down but the dizziness when standing and walking has gotten better.  Triage Disposition: Go to ED Now (Notify PCP)  Patient/caregiver understands and will follow disposition?: Yes          Copied from CRM #[id number]. Topic: Clinical - Red Word Triage >> Sep 09, 2024  1:00 PM Winona R wrote: Dizziness, BP systolic pressure is 170 and was  205 at about 12pm  Pt states she does not know what the diastolic number was as she doesn't know how to read it. Pt states ythe dizziness went away but ever time she lays down it starts again Reason for Disposition  [1] Systolic BP >= 160 OR Diastolic >= 100 AND [2] cardiac (e.g., breathing difficulty, chest pain) or neurologic symptoms (e.g., new-onset blurred or double vision, unsteady gait)  Answer Assessment - Initial Assessment Questions Patient states she went and got her hair done this morning and when she got home, she felt dizzy when she got out of her car She went inside and checked her blood pressure and the top number was in the 180s. She states it was around 205 but it had came down some to 170 when she calmed down some. She states her blood pressure does go up when she gets excited  Patient also went to lay down and she got really dizzy Patient states that the dizziness is better when she is standing but when she tries to lay down she gets really dizzy. Patient does take blood pressure medication. Patient states it feels like a spinning sensation She states she had an inner ear problem a long time ago.  186/83 104 pulse at 1:15pm 203/80 99 pulse at 1:20pm  Patient denies any  falls or injuries lately Patient denies being sick recently. She states if she lays down she gets severely dizzy She denies any pain or numbness, headache, blurry vision or changes in vision. Patient is advised with her symptoms the ER is recommended. She states she didn't want to go to the Emergency Room. She wanted to see her PCP Dr Jacqulyn Ahle. She is advised that at this time the recommendation is the ER and if anything gets worse to call 911. She states her son is there with her at this time. She is advised that it is recommended that someone else drive her.  She states that she will go to the ER. She is advised to call us  back with any further questions/concerns. She verbalized understanding.  Protocols used: Blood Pressure - High-A-AH

## 2024-09-09 NOTE — ED Provider Notes (Signed)
 " [facility] Provider Note   CSN: [id number] Arrival date & time: 09/09/24  1335     Patient presents with: Dizziness   Debra George is a 83 y.o. female.    Dizziness Patient presents after episode of dizziness with the room spinning around.  Had elevated blood pressure open patient began after having her haircut.  The room spinning quickly when she laid back.  Did feel some ringing in her left ear.  No numbness or weakness.  States she has had vertigo before.  No headache.  No confusion.  No trauma.  Patient reports she has bad whitecoat hypertension.    Past Medical History:  Diagnosis Date   Macular degeneration   Vertigo hypertension  Prior to Admission medications  Medication Sig Start Date End Date Taking? Authorizing Provider  meclizine  (ANTIVERT ) 12.5 MG tablet Take 1 tablet (12.5 mg total) by mouth 3 (three) times daily as needed for dizziness. 09/09/24  Yes Patsey Lot, MD  amLODipine  (NORVASC ) 2.5 MG tablet Take 1 tablet (2.5 mg total) by mouth daily. 02/16/24   Cook, Jayce G, DO  calcium-vitamin D (OSCAL WITH D) 500-200 MG-UNIT per tablet Take 1 tablet by mouth 2 (two) times daily.    [provider]  cetirizine  (ZYRTEC ) 5 MG tablet Take 1 tablet (5 mg total) by mouth daily. 10/07/23   Cook, Jayce G, DO  ciclopirox  (PENLAC ) 8 % solution Apply topically at bedtime. Apply over nail and surrounding skin. Apply daily over previous coat. After seven (7) days, may remove with alcohol and continue cycle. 05/24/24   McDonald, Adam R, DPM  ipratropium (ATROVENT ) 0.06 % nasal spray Place 2 sprays into both nostrils 4 (four) times daily as needed for rhinitis. 10/07/23   Cook, Jayce G, DO  Multiple Vitamins-Minerals (PRESERVISION AREDS 2 PO) Take 1 tablet by mouth 2 (two) times daily.    [provider]    Allergies: Pneumococcal polysaccharide vaccine    Review of Systems  Neurological:  Positive for  dizziness.    Updated Vital Signs BP (!) 175/92   Pulse 88   Temp 98 F (36.7 C) (Oral)   Resp 18   Ht 5' 8 (1.727 m)   Wt 56.2 kg   SpO2 100%   BMI 18.84 kg/m   Physical Exam Vitals reviewed.  HENT:     Head: Atraumatic.     Ears:     Comments: Does have an effusion behind left TM.  No erythema or bulging. Cardiovascular:     Rate and Rhythm: Normal rate.  Pulmonary:     Breath sounds: No wheezing.  Abdominal:     Tenderness: There is no abdominal tenderness.  Musculoskeletal:        General: No tenderness.  Neurological:     Mental Status: She is alert.     Comments: Awake appropriate.  Finger-nose intact.  Diagnosed with left     (all labs ordered are listed, but only abnormal results are displayed) Labs Reviewed  COMPREHENSIVE METABOLIC PANEL WITH GFR - Abnormal; Notable for the following components:      Result Value   Glucose, Bld 104 (*)    Total Protein 6.3 (*)    All other components within normal limits  URINALYSIS, ROUTINE W REFLEX MICROSCOPIC - Abnormal; Notable for the following components:   Color, Urine COLORLESS (*)    Specific Gravity, Urine 1.001 (*)    All other components within normal limits  CBG  MONITORING, ED - Abnormal; Notable for the following components:   Glucose-Capillary 116 (*)    All other components within normal limits  CBC  CBG MONITORING, ED    EKG: EKG Interpretation Date/Time:  Friday September 09 2024 13:59:11 EST Ventricular Rate:  98 PR Interval:  136 QRS Duration:  90 QT Interval:  356 QTC Calculation: 454 R Axis:   49  Text Interpretation: Normal sinus rhythm with sinus arrhythmia Nonspecific ST abnormality Confirmed by Bernard Drivers ([id number]) on 09/09/2024 2:08:12 PM  Radiology: CT HEAD WO CONTRAST Result Date: 09/09/2024 EXAM: CT HEAD WITHOUT CONTRAST 09/09/2024 02:26:27 PM TECHNIQUE: CT of the head was performed without the administration of intravenous contrast. Automated exposure control, iterative  reconstruction, and/or weight based adjustment of the mA/kV was utilized to reduce the radiation dose to as low as reasonably achievable. COMPARISON: 11/09/2022. CLINICAL HISTORY: Vertigo, central. FINDINGS: BRAIN AND VENTRICLES: No acute hemorrhage. No evidence of acute infarct. No hydrocephalus. No extra-axial collection. No mass effect or midline shift. Stable mild chronic small vessel disease. ORBITS: No acute abnormality. SINUSES: No acute abnormality. SOFT TISSUES AND SKULL: No acute soft tissue abnormality. Age indeterminate right nasal bone fracture, new since 2024. IMPRESSION: 1. No acute intracranial abnormality. 2. Age indeterminate right nasal bone fracture, new since 2024. Electronically signed by: Ryan Chess MD 09/09/2024 02:45 PM EST RP Workstation: [id number]     Procedures   Medications Ordered in the ED - No data to display                                  Medical Decision Making Amount and/or Complexity of Data Reviewed Labs: ordered. Radiology: ordered.   Patient dizziness.  The room was spinning.  Differential diagnosis includes vertigo both central and peripheral cause.  However think more likely peripheral.  There is no ringing in the left ear.  However with the high blood pressure and feeling little off we will get some basic blood work also.  Head CT done and reassuring.  Will inform her.  I think most likely peripheral vertigo.  Will treat with Antivert  as needed.  Discharge home with PCP follow-up      Final diagnoses:  Vertigo    ED Discharge Orders          Ordered    meclizine  (ANTIVERT ) 12.5 MG tablet  3 times daily PRN        09/09/24 1727               Patsey Lot, MD 09/09/24 1727  "

## 2024-09-14 ENCOUNTER — Inpatient Hospital Stay: Admitting: Family Medicine

## 2024-09-16 ENCOUNTER — Ambulatory Visit: Admitting: Nurse Practitioner

## 2024-09-16 VITALS — BP 191/89 | HR 99 | Temp 97.9°F | Ht 68.0 in | Wt 124.5 lb

## 2024-09-16 DIAGNOSIS — I10 Essential (primary) hypertension: Secondary | ICD-10-CM | POA: Diagnosis not present

## 2024-09-16 DIAGNOSIS — F419 Anxiety disorder, unspecified: Secondary | ICD-10-CM | POA: Diagnosis not present

## 2024-09-16 MED ORDER — VALSARTAN 80 MG PO TABS: 80.0000 mg | ORAL_TABLET | Freq: Every day | ORAL | 0 refills | Status: DC

## 2024-09-17 ENCOUNTER — Encounter: Payer: Self-pay | Admitting: Nurse Practitioner

## 2024-09-17 NOTE — Progress Notes (Signed)
 "  Subjective:    Patient ID: Debra  MAKHIYA George, female    DOB: 20-May-1941, 84 y.o.   MRN: [id number]  HPI Discussed the use of AI scribe software for clinical note transcription with the patient, who gave verbal consent to proceed.  History of Present Illness Lea  H Carlin is an 84 year old female with hypertension who presents with elevated blood pressure and vertigo.  She has experienced elevated blood pressure readings at home, with a recent reading of 137 systolic, increasing to 160 systolic with anxiety.  In the emergency department, her blood pressure ranged from 170 systolic to 200 systolic. She has a history of 'white coat syndrome' with typical readings around 150 systolic, but recent readings over the past couple of weeks  have been higher. She is currently taking amlodipine  2.5 mg for hypertension but experiences side effects including mild swelling and a rash on her face, which have prevented her from increasing the dose to 5 mg.   She went to the hospital after experiencing vertigo, which she attributes to water  entering her ear during a hair appointment. The dizziness has since resolved. She mentions a history of fluid behind the eardrum, noted by a previous provider.  Her husband passed away two years ago, and she has been managing his estate recently, which has been stressful. She has also been dealing with [employer] regarding account changes, adding to her stress. She is active in her church and enjoys yard work, although she has had to stop due to cold weather and a back problem.  She has a history of taking medication for depression, prescribed by a previous provider, which she discontinued years ago. She describes herself as having high energy and being prone to anxiety, which she manages by staying active. She reports occasional feelings of nervousness that resolve when she engages in activities.  No chest pain, shortness of breath, or cough.   Review of Systems   Constitutional:  Positive for fatigue.  Respiratory:  Negative for cough, chest tightness, shortness of breath and wheezing.   Cardiovascular:  Negative for chest pain and leg swelling.  Neurological:  Negative for dizziness, speech difficulty, weakness and light-headedness.      09/16/2024   10:30 AM  Depression screen PHQ 2/9  Decreased Interest 0  Down, Depressed, Hopeless 0  PHQ - 2 Score 0  Altered sleeping 0  Tired, decreased energy 0  Change in appetite 0  Feeling bad or failure about yourself  0  Trouble concentrating 0  Moving slowly or fidgety/restless 0  Suicidal thoughts 0  PHQ-9 Score 0  Difficult doing work/chores Not difficult at all      09/16/2024   10:30 AM 08/17/2024   11:10 AM 02/16/2024   11:20 AM 01/20/2024    9:00 AM  GAD 7 : Generalized Anxiety Score  Nervous, Anxious, on Edge 0 0 0 0  Control/stop worrying 0 0 0 0  Worry too much - different things 0 0 0 0  Trouble relaxing 0 0 0 0  Restless 0 0 0 0  Easily annoyed or irritable 0 0 0 0  Afraid - awful might happen 0 0 0 0  Total GAD 7 Score 0 0 0 0  Anxiety Difficulty Not difficult at all Not difficult at all Not difficult at all Not difficult at all    Social History[1]      Objective:   Physical Exam NAD.  Alert, oriented.  Mildly anxious affect.  Cheerful and smiling.  Speech clear.  Normal behavior and cognition.  TMs mild clear effusion, no erythema.  Pharynx clear and moist.  Neck supple without adenopathy.  Lungs clear.  Heart regular rate rhythm.  Carotids no bruits or thrills.  Lower extremities no edema. Today's Vitals   09/16/24 1028  BP: (!) 191/89  Pulse: 99  Temp: 97.9 F (36.6 C)  SpO2: 99%  Weight: 124 lb 8 oz (56.5 kg)  Height: 5' 8 (1.727 m)   Body mass index is 18.93 kg/m.  Labs from ED 12/26: Results for orders placed or performed during the hospital encounter of 09/09/24  CBG monitoring, ED   Collection Time: 09/09/24  1:57 PM  Result Value Ref Range    Glucose-Capillary 116 (H) 70 - 99 mg/dL  Comprehensive metabolic panel   Collection Time: 09/09/24  1:58 PM  Result Value Ref Range   Sodium 141 135 - 145 mmol/L   Potassium 3.8 3.5 - 5.1 mmol/L   Chloride 103 98 - 111 mmol/L   CO2 30 22 - 32 mmol/L   Glucose, Bld 104 (H) 70 - 99 mg/dL   BUN 11 8 - 23 mg/dL   Creatinine, Ser 9.39 0.44 - 1.00 mg/dL   Calcium 9.2 8.9 - 89.6 mg/dL   Total Protein 6.3 (L) 6.5 - 8.1 g/dL   Albumin 4.2 3.5 - 5.0 g/dL   AST 27 15 - 41 U/L   ALT 20 0 - 44 U/L   Alkaline Phosphatase 78 38 - 126 U/L   Total Bilirubin 0.4 0.0 - 1.2 mg/dL   GFR, Estimated >39 >39 mL/min   Anion gap 8 5 - 15  CBC   Collection Time: 09/09/24  1:58 PM  Result Value Ref Range   WBC 6.4 4.0 - 10.5 K/uL   RBC 4.12 3.87 - 5.11 MIL/uL   Hemoglobin 12.7 12.0 - 15.0 g/dL   HCT 61.6 63.9 - 53.9 %   MCV 93.0 80.0 - 100.0 fL   MCH 30.8 26.0 - 34.0 pg   MCHC 33.2 30.0 - 36.0 g/dL   RDW 86.5 88.4 - 84.4 %   Platelets 178 150 - 400 K/uL   nRBC 0.0 0.0 - 0.2 %  Urinalysis, Routine w reflex microscopic -Urine, Clean Catch   Collection Time: 09/09/24  4:30 PM  Result Value Ref Range   Color, Urine COLORLESS (A) YELLOW   APPearance CLEAR CLEAR   Specific Gravity, Urine 1.001 (L) 1.005 - 1.030   pH 7.0 5.0 - 8.0   Glucose, UA NEGATIVE NEGATIVE mg/dL   Hgb urine dipstick NEGATIVE NEGATIVE   Bilirubin Urine NEGATIVE NEGATIVE   Ketones, ur NEGATIVE NEGATIVE mg/dL   Protein, ur NEGATIVE NEGATIVE mg/dL   Nitrite NEGATIVE NEGATIVE   Leukocytes,Ua NEGATIVE NEGATIVE   09/09/2024 CT scan of the head without contrast showed no acute intercranial abnormality.        Assessment & Plan:  1. White coat syndrome with hypertension (Primary) Recent exacerbation with readings up to 205/89, likely due to anxiety and stress. Amlodipine  caused adverse effects. Goal: BP closer to 140/90. - Discontinued amlodipine . - Initiated new antihypertensive medication. - Instructed to monitor BP at home  daily and maintain a log. - Scheduled follow-up in a few weeks to review BP log and adjust treatment. - valsartan  (DIOVAN ) 80 MG tablet; Take 1 tablet (80 mg total) by mouth daily.  Dispense: 30 tablet; Refill: 0  2. Anxiety Chronic anxiety disorder exacerbated by personal stressors, contributing to elevated BP. Discussed non-addictive medication  options. - Consider starting anxiety medication at follow-up, focusing on non-addictive options. - Encouraged consideration of medication for anxiety management.  Warning signs reviewed. Call back or go to ED if any new or worsening symptoms.       [1]  Social History Tobacco Use   Smoking status: Former   Smokeless tobacco: Never   Tobacco comments:    social smoker, quit 1990  Substance Use Topics   Alcohol use: No    Alcohol/week: 0.0 standard drinks of alcohol   Drug use: No   "

## 2024-09-20 ENCOUNTER — Other Ambulatory Visit (HOSPITAL_COMMUNITY): Payer: Self-pay | Admitting: Family Medicine

## 2024-09-20 DIAGNOSIS — Z1231 Encounter for screening mammogram for malignant neoplasm of breast: Secondary | ICD-10-CM

## 2024-09-23 ENCOUNTER — Emergency Department (HOSPITAL_COMMUNITY)

## 2024-09-23 ENCOUNTER — Emergency Department (HOSPITAL_COMMUNITY): Admission: EM | Admit: 2024-09-23 | Discharge: 2024-09-23 | Disposition: A | Discharge status: Home / Self Care

## 2024-09-23 DIAGNOSIS — R42 Dizziness and giddiness: Secondary | ICD-10-CM | POA: Diagnosis present

## 2024-09-23 DIAGNOSIS — I10 Essential (primary) hypertension: Secondary | ICD-10-CM | POA: Insufficient documentation

## 2024-09-23 DIAGNOSIS — Z79899 Other long term (current) drug therapy: Secondary | ICD-10-CM | POA: Insufficient documentation

## 2024-09-23 LAB — CBC WITH DIFFERENTIAL/PLATELET
Abs Immature Granulocytes: 0.03 K/uL (ref 0.00–0.07)
Basophils Absolute: 0 K/uL (ref 0.0–0.1)
Basophils Relative: 0 %
Eosinophils Absolute: 0.1 K/uL (ref 0.0–0.5)
Eosinophils Relative: 1 %
HCT: 38.4 % (ref 36.0–46.0)
Hemoglobin: 12.7 g/dL (ref 12.0–15.0)
Immature Granulocytes: 0 %
Lymphocytes Relative: 19 %
Lymphs Abs: 1.3 K/uL (ref 0.7–4.0)
MCH: 30.7 pg (ref 26.0–34.0)
MCHC: 33.1 g/dL (ref 30.0–36.0)
MCV: 92.8 fL (ref 80.0–100.0)
Monocytes Absolute: 0.5 K/uL (ref 0.1–1.0)
Monocytes Relative: 7 %
Neutro Abs: 5.1 K/uL (ref 1.7–7.7)
Neutrophils Relative %: 73 %
Platelets: 163 K/uL (ref 150–400)
RBC: 4.14 MIL/uL (ref 3.87–5.11)
RDW: 13.5 % (ref 11.5–15.5)
WBC: 7.1 K/uL (ref 4.0–10.5)
nRBC: 0 % (ref 0.0–0.2)

## 2024-09-23 LAB — BASIC METABOLIC PANEL WITH GFR
Anion gap: 6 (ref 5–15)
BUN: 11 mg/dL (ref 8–23)
CO2: 31 mmol/L (ref 22–32)
Calcium: 9 mg/dL (ref 8.9–10.3)
Chloride: 107 mmol/L (ref 98–111)
Creatinine, Ser: 0.62 mg/dL (ref 0.44–1.00)
GFR, Estimated: >60 mL/min
Glucose, Bld: 110 mg/dL — ABNORMAL HIGH (ref 70–99)
Potassium: 4 mmol/L (ref 3.5–5.1)
Sodium: 144 mmol/L (ref 135–145)

## 2024-09-23 LAB — TROPONIN T, HIGH SENSITIVITY: Troponin T High Sensitivity: 15 ng/L (ref 0–19)

## 2024-09-23 MED ORDER — IOHEXOL 350 MG/ML SOLN
75.0000 mL | Freq: Once | INTRAVENOUS | Status: AC | PRN
  Administered 2024-09-23: 75 mL via INTRAVENOUS

## 2024-09-23 MED ORDER — HYDROCHLOROTHIAZIDE 12.5 MG PO CAPS: 12.5000 mg | ORAL_CAPSULE | Freq: Every day | ORAL | 0 refills | Status: DC

## 2024-09-23 NOTE — ED Provider Notes (Addendum)
 " Debra George Provider Note   CSN: [id number] Arrival date & time: 09/23/24  9660     Patient presents with: Dizziness   Debra  ELIAS George is a 84 y.o. female.   84 year old female presents for evaluation of dizziness.  States she woke up in the middle of the night and had some severe dizziness, felt like the room was spinning and was very off-balance.  States she also had some ringing in her left ear.  States she does have a history of vertigo.  She took a meclizine  at home and is feeling much better.  Son at bedside also states that she has had some elevated blood pressure as well.  Patient denies any other symptoms or concerns at this time.   Dizziness Associated symptoms: no chest pain, no palpitations, no shortness of breath and no vomiting        Prior to Admission medications  Medication Sig Start Date End Date Taking? Authorizing Provider  hydrochlorothiazide  (MICROZIDE ) 12.5 MG capsule Take 1 capsule (12.5 mg total) by mouth daily. 09/23/24 10/07/24 Yes Ernan Runkles L, DO  calcium-vitamin D (OSCAL WITH D) 500-200 MG-UNIT per tablet Take 1 tablet by mouth 2 (two) times daily.    [provider]  cetirizine  (ZYRTEC ) 5 MG tablet Take 1 tablet (5 mg total) by mouth daily. 10/07/23   Cook, Jayce G, DO  ciclopirox  (PENLAC ) 8 % solution Apply topically at bedtime. Apply over nail and surrounding skin. Apply daily over previous coat. After seven (7) days, may remove with alcohol and continue cycle. 05/24/24   McDonald, Adam R, DPM  ipratropium (ATROVENT ) 0.06 % nasal spray Place 2 sprays into both nostrils 4 (four) times daily as needed for rhinitis. 10/07/23   Cook, Jayce G, DO  meclizine  (ANTIVERT ) 12.5 MG tablet Take 1 tablet (12.5 mg total) by mouth 3 (three) times daily as needed for dizziness. 09/09/24   Patsey Lot, MD  Multiple Vitamins-Minerals (PRESERVISION AREDS 2 PO) Take 1 tablet by mouth 2 (two) times daily.     [provider]  valsartan  (DIOVAN ) 80 MG tablet Take 1 tablet (80 mg total) by mouth daily. 09/16/24   Mauro Elveria BROCKS, NP    Allergies: Pneumococcal polysaccharide vaccine and Amlodipine     Review of Systems  Constitutional:  Negative for chills and fever.  HENT:  Negative for ear pain and sore throat.   Eyes:  Negative for pain and visual disturbance.  Respiratory:  Negative for cough and shortness of breath.   Cardiovascular:  Negative for chest pain and palpitations.  Gastrointestinal:  Negative for abdominal pain and vomiting.  Genitourinary:  Negative for dysuria and hematuria.  Musculoskeletal:  Negative for arthralgias and back pain.  Skin:  Negative for color change and rash.  Neurological:  Positive for dizziness. Negative for seizures and syncope.  All other systems reviewed and are negative.   Updated Vital Signs BP (!) 190/85   Pulse 97   Temp 97.9 F (36.6 C) (Oral)   Resp 16   SpO2 98%   Physical Exam Vitals and nursing note reviewed.  Constitutional:      General: She is not in acute distress.    Appearance: Normal appearance. She is well-developed. She is not ill-appearing.  HENT:     Head: Normocephalic and atraumatic.  Eyes:     Conjunctiva/sclera: Conjunctivae normal.  Cardiovascular:     Rate and Rhythm: Normal rate and regular rhythm.     Heart  sounds: No murmur heard. Pulmonary:     Effort: Pulmonary effort is normal. No respiratory distress.     Breath sounds: Normal breath sounds.  Abdominal:     Palpations: Abdomen is soft.     Tenderness: There is no abdominal tenderness.  Musculoskeletal:        General: No swelling.     Cervical back: Neck supple.  Skin:    General: Skin is warm and dry.     Capillary Refill: Capillary refill takes less than 2 seconds.  Neurological:     General: No focal deficit present.     Mental Status: She is alert. Mental status is at baseline.     Cranial Nerves: No cranial nerve deficit.      Sensory: No sensory deficit.     Motor: No weakness.     Coordination: Coordination normal.     Gait: Gait normal.  Psychiatric:        Mood and Affect: Mood normal.     (all labs ordered are listed, but only abnormal results are displayed) Labs Reviewed  BASIC METABOLIC PANEL WITH GFR - Abnormal; Notable for the following components:      Result Value   Glucose, Bld 110 (*)    All other components within normal limits  CBC WITH DIFFERENTIAL/PLATELET  TROPONIN T, HIGH SENSITIVITY  TROPONIN T, HIGH SENSITIVITY    EKG: EKG Interpretation Date/Time:  Friday September 23 2024 04:54:33 EST Ventricular Rate:  93 PR Interval:  142 QRS Duration:  104 QT Interval:  368 QTC Calculation: 458 R Axis:   63  Text Interpretation: Sinus rhythm RSR' in V1 or V2, right VCD or RVH Compared with prior EKG from 09/09/2024 Confirmed by Gennaro Bouchard ([id number]) on 09/23/2024 4:59:13 AM  Radiology: CT ANGIO HEAD NECK W WO CM Result Date: 09/23/2024 EXAM: CTA Head and Neck with Intravenous Contrast. CT Head without Contrast. CLINICAL HISTORY: 84 year old female with acute dizziness. TECHNIQUE: Axial CTA images of the head and neck performed with intravenous contrast. MIP reconstructed images were created and reviewed. Axial computed tomography images of the head/brain performed without intravenous contrast. Note: Per PQRS, the description of internal carotid artery percent stenosis, including 0 percent or normal exam, is based on North American Symptomatic Carotid Endarterectomy Trial (NASCET) criteria. Dose reduction technique was used including one or more of the following: automated exposure control, adjustment of mA and kV according to patient size, and/or iterative reconstruction. CONTRAST: With and without IV contrast. COMPARISON: CT head 09/09/2024. FINDINGS: CT HEAD: BRAIN: Stable brain volume. Patchy and asymmetric periventricular white matter hypodensity is stable, mild to moderate for age, greater in  the left hemisphere. Preserved gray white differentiation. No acute intraparenchymal hemorrhage. No mass lesion. No CT evidence for acute territorial infarct. No midline shift or extra-axial collection. Calcified atherosclerosis at the skull base. VENTRICLES: No hydrocephalus. ORBITS: No gaze deviation. SINUSES AND MASTOIDS: Tympanic cavities, mastoids and paranasal sinuses are clear. 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 Moderate calcified arch atherosclerosis. COMMON CAROTID ARTERIES: Right CCA: No significant right CCA or right carotid bifurcation atherosclerosis. No cervical right carotid stenosis. Left CCA: Slightly bovine type left CCA origin, normal variant. Calcified left CCA origin plaque without stenosis. Minimal left carotid bifurcation atherosclerosis. Right ICA: Right ICA siphon is patent with moderate calcified atherosclerosis of the distal cavernous and supraclinoid segments. Mild to moderate anterior genu level stenosis on series 15 image 116. Left ICA: Mild calcified plaque  of the left ICA just below the skull base. No cervical left carotid stenosis. Left ICA siphon moderate calcified plaque at the anterior genu and supraclinoid segment. Mild distal supraclinoid stenosis (series 19 image 109). 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 Heavily calcified atherosclerosis near the origin of the right vertebral artery, but only mild right vertebral artery origin stenosis (series 17 image 162). Tortuous right V1 segment. Calcified right vertebral artery V4 segment atherosclerosis with no hemodynamically significant right vertebral artery stenosis. Left Vertebral Artery: Proximal left subclavian artery calcified atherosclerosis without stenosis but continues to the left vertebral artery origin with moderate to  severe left vertebral origin stenosis on series 17 image 175. The left vertebral remains patent and is fairly codominant. Normal left PICA origin. Left vertebral V4 mild to moderate atherosclerosis in the posterior fossa and up to moderate stenosis at the left vertebrobasilar junction on series 17 image 127. Overall: No dissection or occlusion. CTA HEAD: ANTERIOR CEREBRAL ARTERIES: Normal ACA origins. Dominant left and diminutive right ACA A1 segments (normal variant). Normally anterior communicating artery. No significant stenosis. No occlusion. No aneurysm. MIDDLE CEREBRAL ARTERIES: Normal MCA origins. Mild to moderate right MCA M1 segment irregularity and stenosis on series 21 image 19 and series 23 image 18. Mild irregularity and stenosis at the right MCA bifurcation. Other MCA branches within normal limits. No occlusion. No aneurysm. POSTERIOR CEREBRAL ARTERIES: Normal PCA origins. Mild right PCA irregularity without significant stenosis. No occlusion. No aneurysm. BASILAR ARTERY: No basilar artery stenosis. No occlusion. No aneurysm. Normal SCA origins. INTRACRANIAL VENOUS: Dominant right transverse and sigmoid venous sinuses (normal variant). Major dural venous sinuses are enhancing and appear patent. SOFT TISSUES: Negative visible mediastinum and nonvascular soft tissue spaces of the neck. BONES: Dentition is absent. Ordinary cervical spine degeneration. No acute osseous abnormality. LUNGS: Centrilobular emphysema in the upper lungs. IMPRESSION: 1. No acute intracranial mild to moderate for age white matter changes most commonly due to small vessel disease. 2. Negative for large vessel occlusion. Positive for atherosclerosis in the head and neck with the following significant stenoses: 3. 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 4. Up to Moderate stenosis right ICA siphon, Right MCA M1. 5. Emphysema. Electronically signed by: Helayne Hurst  MD MD 09/23/2024 06:54 AM EST RP Workstation: [id number]     Procedures   Medications Ordered in the ED  iohexol  (OMNIPAQUE ) 350 MG/ML injection 75 mL (75 mLs Intravenous Contrast Given 09/23/24 0622)                                    Medical Decision Making Cardiac monitor interpretation: Sinus rhythm, no ectopy  Patient here for dizziness that is resolved upon arrival.  Likely vertigo.  She took meclizine  at home.  I do long discussion with her and family at bedside and they would like to switch blood pressure medications.  She has a follow-up in a week.  I will switch her to hydrochlorothiazide  from amlodipine .  Given prescription for this.  Advised to continue keeping a log and to follow-up with primary care.  Her labs are unremarkable but her CTA of her head and neck is pending at this time.  She has no focal deficits on exam.  She feels comfortable being discharged home. Advised to return for any new or  worsening symptoms.   Problems Addressed: Hypertension, unspecified type: chronic illness or injury with exacerbation, progression, or side effects of treatment Vertigo: acute illness or injury  Amount and/or Complexity of Data Reviewed External Data Reviewed: notes.    Details: Prior ED records reviewed and patient recently seen for vertigo Labs: ordered. Decision-making details documented in ED Course.    Details: Ordered and reviewed by me and unremarkable Radiology: ordered and independent interpretation performed. Decision-making details documented in ED Course.    Details: Ordered and interpreted by me independently of radiology CTA head and neck shows stenosis but no acute abnormality  ECG/medicine tests: ordered and independent interpretation performed. Decision-making details documented in ED Course.    Details: Ordered and interpreted by me in the absence of cardiology and shows sinus rhythm, no STEMI, or significant change when compared to prior EKG  Risk OTC  drugs. Prescription drug management.     Final diagnoses:  Vertigo  Hypertension, unspecified type    ED Discharge Orders          Ordered    hydrochlorothiazide  (MICROZIDE ) 12.5 MG capsule  Daily        09/23/24 0647               Gennaro Duwaine CROME, DO 09/23/24 9350    Gennaro Duwaine CROME, DO 09/23/24 9341  "

## 2024-09-23 NOTE — ED Notes (Signed)
 Patient transported to CT

## 2024-09-23 NOTE — ED Notes (Signed)
 Assisted pt to the bathroom by wheelchair. Pt states she is starting to feel better. Pt was able to get up and move independently with tech standing by.

## 2024-09-23 NOTE — Discharge Instructions (Signed)
 Take your blood pressure medication as prescribed.  You can stop your amlodipine  and switch to hydrochlorothiazide  but continue your log and follow-up with primary care next week.  Use your meclizine  as needed for dizziness.  Return to the ER for new or worsening symptoms.

## 2024-09-23 NOTE — ED Triage Notes (Signed)
 Pt c/o dizziness that started when she woke up this morning, states this was at 2 am and took something for vertigo which helped a little also c/o mid sternal CP. States she was seen here a couple weeks ago for the same dizziness and was Dx with vertigo

## 2024-09-30 ENCOUNTER — Ambulatory Visit: Admitting: Nurse Practitioner

## 2024-09-30 VITALS — BP 158/78 | HR 102 | Temp 97.9°F | Ht 68.0 in | Wt 124.3 lb

## 2024-09-30 DIAGNOSIS — F419 Anxiety disorder, unspecified: Secondary | ICD-10-CM | POA: Diagnosis not present

## 2024-09-30 DIAGNOSIS — I10 Essential (primary) hypertension: Secondary | ICD-10-CM

## 2024-09-30 NOTE — Progress Notes (Unsigned)
" ° °  Subjective:    Patient ID: Debra  KEISI George, female    DOB: 20-Feb-1941, 84 y.o.   MRN: [id number]  HPI Patient is in room 10  Patient is here for a follow up on blood pressure   Patient was in the hospital last Friday for dizziness and Dr. Prescribed BP medication. Patient stated she never took any of the BP medications until a week ago. So has only taken BP prescribed by Elveria  Her BP cuff is accurate with ours    Review of Systems     Objective:   Physical Exam        Assessment & Plan:    "

## 2024-10-01 ENCOUNTER — Encounter: Payer: Self-pay | Admitting: Nurse Practitioner

## 2024-10-01 DIAGNOSIS — I10 Essential (primary) hypertension: Secondary | ICD-10-CM | POA: Insufficient documentation

## 2024-10-14 ENCOUNTER — Ambulatory Visit (HOSPITAL_COMMUNITY)
Admission: RE | Admit: 2024-10-14 | Discharge: 2024-10-14 | Disposition: A | Source: Ambulatory Visit | Attending: Family Medicine | Admitting: Family Medicine

## 2024-10-14 DIAGNOSIS — Z1231 Encounter for screening mammogram for malignant neoplasm of breast: Secondary | ICD-10-CM | POA: Insufficient documentation

## 2024-10-19 ENCOUNTER — Other Ambulatory Visit: Payer: Self-pay | Admitting: Nurse Practitioner

## 2024-10-19 DIAGNOSIS — I10 Essential (primary) hypertension: Secondary | ICD-10-CM

## 2024-10-31 ENCOUNTER — Ambulatory Visit: Admitting: Nurse Practitioner

## 2025-03-03 ENCOUNTER — Ambulatory Visit
